# Patient Record
Sex: Female | Born: 1985 | Race: White | Hispanic: No | Marital: Married | Smoking: Never smoker
Health system: Southern US, Community
[De-identification: ages and names within clinical notes are randomized; demographics above are authoritative.]

## PROBLEM LIST (undated history)

## (undated) DIAGNOSIS — D649 Anemia, unspecified: Secondary | ICD-10-CM

## (undated) HISTORY — PX: LITHOTRIPSY: SUR834

## (undated) HISTORY — PX: BREAST BIOPSY: SHX20

## (undated) HISTORY — PX: BREAST EXCISIONAL BIOPSY: SUR124

---

## 2020-12-19 ENCOUNTER — Other Ambulatory Visit: Payer: Self-pay | Admitting: Family Medicine

## 2020-12-19 DIAGNOSIS — N9489 Other specified conditions associated with female genital organs and menstrual cycle: Secondary | ICD-10-CM

## 2021-01-02 ENCOUNTER — Other Ambulatory Visit: Payer: Self-pay | Admitting: Obstetrics and Gynecology

## 2021-01-02 DIAGNOSIS — N63 Unspecified lump in unspecified breast: Secondary | ICD-10-CM

## 2021-01-08 ENCOUNTER — Other Ambulatory Visit: Payer: Self-pay

## 2021-01-10 ENCOUNTER — Inpatient Hospital Stay
Admission: RE | Admit: 2021-01-10 | Discharge: 2021-01-10 | Disposition: A | Discharge status: Home / Self Care | Payer: Self-pay | Source: Ambulatory Visit | Attending: *Deleted | Admitting: *Deleted

## 2021-01-10 ENCOUNTER — Other Ambulatory Visit: Payer: Self-pay | Admitting: *Deleted

## 2021-01-10 DIAGNOSIS — Z1231 Encounter for screening mammogram for malignant neoplasm of breast: Secondary | ICD-10-CM

## 2021-01-16 ENCOUNTER — Ambulatory Visit
Admission: RE | Admit: 2021-01-16 | Discharge: 2021-01-16 | Disposition: A | Discharge status: Home / Self Care | Payer: 59 | Source: Ambulatory Visit | Attending: Family Medicine | Admitting: Family Medicine

## 2021-01-16 ENCOUNTER — Encounter: Payer: Self-pay | Admitting: *Deleted

## 2021-01-16 DIAGNOSIS — N9489 Other specified conditions associated with female genital organs and menstrual cycle: Secondary | ICD-10-CM

## 2021-01-16 HISTORY — PX: IR RADIOLOGIST EVAL & MGMT: IMG5224

## 2021-01-16 NOTE — Consult Note (Signed)
Chief Complaint: Patient was seen in consultation today for pelvic congestion  Referring Physician(s): Linus Mako  Supervising Physician: Aletta Edouard  Patient Status: [facility] Radiology outpatient clinic  History of Present Illness: Frances Barnes is a 35 y.o. 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 She first noticed these pelvic varicosities with her second pregnancy with resolution of the issue after delivery. She had worsening symptoms with her third pregnancy and symptoms did not fully resolve after delivery. She has experienced more leg and pelvic symptoms since her last pregnancy 6 years ago. The symptoms are particularly pronounced during her menses with increased pelvic pressure and tightness as well as lower extremity aching, pressure and fatigue. She has difficulty completing activities requiring prolonged standing and compression stockings have done nothing to alleviate her symptoms. Laying down occasionally helps decrease symptom severity.   She recently met with Dr. Renaldo Reel with [facility] who ordered duplex ultrasound imaging.   Duplex Ultrasound Study: Left leg study shows refluxing flow in the great saphenous vein from the proximal thigh to the mid-calf with a large contributory varicosity, from the buttock and pelvic region intersecting with the proximal great saphenous vein. Another large pelvic varicosity comes from the posterior buttock region down to the posterior thigh. Deep venous system is patent and compressible throughout. Right leg study shows large pelvic varicosities in the medial-to-posterior thigh feeding into the thigh extension of the small saphenous vein and questionably communicating with the great saphenous vein in the mid-thigh region. There is reflux in the great saphenous vein from the mid-thigh to the ankle.  Impressions: 1. 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   Dr. Renaldo Reel has referred this patient to Interventional Radiology for further work up, evaluation and possible treatment of suspected pelvic congestion syndrome and varicose vein disease.   Allergies: Patient has no known allergies.  Medications: Prior to Admission medications   Not on File     Social History   Socioeconomic History  . Marital status: Married    Spouse name: Not on file  . Number of children: Not on file  . Years of education: Not on file  . Highest education level: Not on file  Occupational History  . Not on file  Tobacco Use  . Smoking status: Not on file  . Smokeless tobacco: Not on file  Substance and Sexual Activity  . Alcohol use: Not on file  . Drug use: Not on file  . Sexual activity: Not on file  Other Topics Concern  . Not on file  Social History Narrative  . Not on file   Social Determinants of Health   Financial Resource Strain: Not on file  Food Insecurity: Not on file  Transportation Needs: Not on file  Physical Activity: Not on file  Stress: Not on file  Social Connections: Not on file    Review of Systems: A 12 point ROS discussed and pertinent positives are indicated in the HPI above.  All other systems are negative.  Review of Systems  Constitutional: Negative for appetite change and fatigue.  Respiratory: Negative for cough and shortness of breath.   Cardiovascular: Negative for chest pain and leg swelling.       Bilateral leg discomfort/pressure/aching; worse during menstrual cycle.   Gastrointestinal: Negative for abdominal pain, diarrhea, nausea and vomiting.  Genitourinary:       Pelvic pressure during menstrual cycle  Musculoskeletal: Negative for back pain.  Neurological: Negative for headaches.    Vital Signs: BP 115/75 (BP Location: Right Arm)   Pulse 73   SpO2 100%   Physical Exam Constitutional:       General: She is not in acute distress.    Appearance: She is not ill-appearing.  Cardiovascular:     Rate and Rhythm: Normal rate and regular rhythm.     Pulses:          Dorsalis pedis pulses are 1+ on the right side and 1+ on the left side.       Posterior tibial pulses are 2+ on the right side and 2+ on the left side.     Heart sounds: Normal heart sounds.     Comments: Subtle varicose veins in the bilateral posterior calf region.  Pulmonary:     Effort: Pulmonary effort is normal.     Breath sounds: Normal breath sounds.  Abdominal:     General: Bowel sounds are normal.     Palpations: Abdomen is soft.  Genitourinary:    Comments: Superficial, tortuous pubic/outer labia varicose vein that extends into the bilateral groin/inner upper thigh region. The vein is soft/compressible and is non-tender.   Musculoskeletal:        General: Normal range of motion.     Right lower leg: No edema.     Left lower leg: No edema.  Feet:     Right foot:     Skin integrity: Skin integrity normal.     Left foot:     Skin integrity: Skin integrity normal.     Comments: Bilateral feet are warm with good capillary refill; no swelling.   Skin:    General: Skin is warm and dry.  Neurological:     Mental Status: She is alert and oriented to person, place, and time.  Psychiatric:        Mood and Affect: Mood normal.        Behavior: Behavior normal.        Thought Content: Thought content normal.        Judgment: Judgment normal.     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 The pathophysiology of varicose veins was discussed including valvular incompetence with reflux of blood into adjacent vascular structures. It was discussed that the etiology of her current varicosities could be  originating from insufficiencies of the bilateral lower veins or possibly from ovarian and/or pelvic veins. A CT venogram of the abdomen and pelvis is required for further assessment and to evaluate possible treatment options including sclerotherapy or embolization.   A scheduler from our office will call Mrs. Kahl with a date and time for her CT scan, pending insurance approval. She knows she can call our office with any questions or concerns.     Thank you for this interesting consult.  I greatly enjoyed meeting Frances Barnes and look forward to participating in their care.  A copy of this report was sent to the requesting provider on this date.  Electronically Signed: Soyla Dryer, AGACNP-BC [phone] 01/16/2021, 11:02 AM   I spent a total of  40 Minutes   in face to face in clinical consultation, greater than 50% of which was counseling/coordinating care for pelvic congestion syndrome/varicose veins.

## 2021-01-17 ENCOUNTER — Ambulatory Visit
Admission: RE | Admit: 2021-01-17 | Discharge: 2021-01-17 | Disposition: A | Discharge status: Home / Self Care | Payer: 59 | Source: Ambulatory Visit | Attending: Obstetrics and Gynecology | Admitting: Obstetrics and Gynecology

## 2021-01-17 ENCOUNTER — Other Ambulatory Visit: Payer: Self-pay | Admitting: Interventional Radiology

## 2021-01-17 ENCOUNTER — Other Ambulatory Visit: Payer: Self-pay

## 2021-01-17 DIAGNOSIS — N63 Unspecified lump in unspecified breast: Secondary | ICD-10-CM | POA: Insufficient documentation

## 2021-01-22 ENCOUNTER — Other Ambulatory Visit: Payer: Self-pay | Admitting: Obstetrics and Gynecology

## 2021-01-22 DIAGNOSIS — R928 Other abnormal and inconclusive findings on diagnostic imaging of breast: Secondary | ICD-10-CM

## 2021-01-22 DIAGNOSIS — N6310 Unspecified lump in the right breast, unspecified quadrant: Secondary | ICD-10-CM

## 2021-01-24 ENCOUNTER — Ambulatory Visit
Admission: RE | Admit: 2021-01-24 | Discharge: 2021-01-24 | Disposition: A | Discharge status: Home / Self Care | Payer: 59 | Source: Ambulatory Visit | Attending: Obstetrics and Gynecology | Admitting: Obstetrics and Gynecology

## 2021-01-24 ENCOUNTER — Other Ambulatory Visit: Payer: Self-pay

## 2021-01-24 DIAGNOSIS — R928 Other abnormal and inconclusive findings on diagnostic imaging of breast: Secondary | ICD-10-CM | POA: Insufficient documentation

## 2021-01-24 DIAGNOSIS — N6310 Unspecified lump in the right breast, unspecified quadrant: Secondary | ICD-10-CM | POA: Insufficient documentation

## 2021-01-24 HISTORY — PX: BREAST BIOPSY: SHX20

## 2021-01-27 LAB — SURGICAL PATHOLOGY

## 2021-01-29 ENCOUNTER — Other Ambulatory Visit: Payer: Self-pay | Admitting: Interventional Radiology

## 2021-01-29 DIAGNOSIS — I863 Vulval varices: Secondary | ICD-10-CM

## 2021-01-29 DIAGNOSIS — N9489 Other specified conditions associated with female genital organs and menstrual cycle: Secondary | ICD-10-CM

## 2021-02-17 ENCOUNTER — Other Ambulatory Visit: Payer: Self-pay

## 2021-02-17 ENCOUNTER — Ambulatory Visit
Admission: RE | Admit: 2021-02-17 | Discharge: 2021-02-17 | Disposition: A | Discharge status: Home / Self Care | Payer: 59 | Source: Ambulatory Visit | Attending: Interventional Radiology | Admitting: Interventional Radiology

## 2021-02-17 DIAGNOSIS — I863 Vulval varices: Secondary | ICD-10-CM | POA: Diagnosis not present

## 2021-02-17 DIAGNOSIS — N9489 Other specified conditions associated with female genital organs and menstrual cycle: Secondary | ICD-10-CM | POA: Insufficient documentation

## 2021-02-17 MED ORDER — IOHEXOL 300 MG/ML  SOLN
100.0000 mL | Freq: Once | INTRAMUSCULAR | Status: AC | PRN
  Administered 2021-02-17: 100 mL via INTRAVENOUS

## 2021-02-18 ENCOUNTER — Other Ambulatory Visit: Payer: Self-pay | Admitting: Interventional Radiology

## 2021-02-18 DIAGNOSIS — I863 Vulval varices: Secondary | ICD-10-CM

## 2021-02-18 DIAGNOSIS — N9489 Other specified conditions associated with female genital organs and menstrual cycle: Secondary | ICD-10-CM

## 2021-02-19 ENCOUNTER — Ambulatory Visit
Admission: RE | Admit: 2021-02-19 | Discharge: 2021-02-19 | Disposition: A | Discharge status: Home / Self Care | Payer: 59 | Source: Ambulatory Visit | Attending: Interventional Radiology | Admitting: Interventional Radiology

## 2021-02-19 ENCOUNTER — Encounter: Payer: Self-pay | Admitting: *Deleted

## 2021-02-19 ENCOUNTER — Other Ambulatory Visit: Payer: Self-pay | Admitting: Interventional Radiology

## 2021-02-19 ENCOUNTER — Other Ambulatory Visit: Payer: Self-pay

## 2021-02-19 DIAGNOSIS — I872 Venous insufficiency (chronic) (peripheral): Secondary | ICD-10-CM

## 2021-02-19 DIAGNOSIS — N9489 Other specified conditions associated with female genital organs and menstrual cycle: Secondary | ICD-10-CM

## 2021-02-19 DIAGNOSIS — I863 Vulval varices: Secondary | ICD-10-CM

## 2021-02-19 HISTORY — PX: IR RADIOLOGIST EVAL & MGMT: IMG5224

## 2021-02-19 NOTE — Progress Notes (Signed)
Chief Complaint: Patient was consulted remotely today (TeleHealth) for follow-up for pelvic and lower extremity venous insufficiency.  History of Present Illness: Frances Barnes is a 35 y.o. 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  A CT venogram was performed on 02/17/2021 in order to determine whether there may be a component of convincing ovarian vein reflux supplying pelvic varicosities.  She states today that she recently had significant lower extremity symptoms during a menstrual cycle with severe pressure and aching in both legs from roughly the knees up to the upper thighs.  This required her to lie down for relief.  After her cycle, the symptoms have improved significantly.   Past Surgical History:  Procedure Laterality Date  . BREAST BIOPSY Right    positive cells, atypical   . BREAST BIOPSY Right    benign   . BREAST BIOPSY Right 01/24/2021   [place] Bx, 10:00 (Q clip), path pending   . BREAST BIOPSY Right 01/24/2021   [place] Bx, 11:00 (Vision clip), path pending   . BREAST EXCISIONAL BIOPSY Right   . IR RADIOLOGIST EVAL & MGMT  01/16/2021    Allergies: Patient has no known allergies.  Medications: Prior to Admission medications   Not on File     Family History  Problem Relation Age of Onset  . Breast cancer Maternal Grandmother     Social History   Socioeconomic History  . Marital status: Married    Spouse name: Not on file  . Number of children: Not on file  . Years of education: Not on file  . Highest education level: Not on file  Occupational History  . Not on file  Tobacco Use  . Smoking status: Not on file  . Smokeless tobacco: Not on file  Substance and Sexual Activity  . Alcohol use: Not on file  . Drug use: Not on file  . Sexual activity: Not on file  Other Topics Concern  . Not on file  Social History Narrative   . Not on file   Social Determinants of Health   Financial Resource Strain: Not on file  Food Insecurity: Not on file  Transportation Needs: Not on file  Physical Activity: Not on file  Stress: Not on file  Social Connections: Not on file    Physical Exam No direct physical exam was performed (except for noted visual exam findings with Video Visits).   Vital Signs: LMP 02/10/2021 Comment: Husband: Vasectomy  Imaging: CT ABDOMEN PELVIS W WO CONTRAST  Result Date: 02/17/2021 CLINICAL DATA:  Symptomatic superficial varicosities of the vulvar region with communication to the saphenous veins bilaterally by prior duplex ultrasound. 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 EXAM: CT ABDOMEN AND PELVIS WITHOUT AND WITH CONTRAST TECHNIQUE: Multidetector CT imaging of the abdomen and pelvis was performed following the standard protocol before and following the bolus administration of intravenous contrast. CONTRAST:  OMNIPAQUE IOHEXOL 300 MG/ML  SOLN COMPARISON:  None. FINDINGS: Lower chest: No acute abnormality. Hepatobiliary: No focal liver abnormality is seen. No gallstones, gallbladder wall thickening, or biliary dilatation. Pancreas: Unremarkable. No pancreatic ductal dilatation or surrounding inflammatory changes. Spleen: Normal in size without focal abnormality. Adrenals/Urinary Tract: Adrenal glands are normal. Punctate nonobstructing renal calculi bilaterally measuring 2 mm or less in size. No hydronephrosis  or renal masses. The bladder is unremarkable. Stomach/Bowel: Bowel shows no evidence of obstruction, ileus, inflammation or lesion. No free air identified. The appendix is normal. Vascular/Lymphatic: No enlarged abdominal or pelvic lymph nodes. Both ovarian veins are visualized by CT. The left ovarian vein measures 7-8 mm in greatest caliber  and the right ovarian vein measures 6 mm in greatest caliber. Both ovarian veins can be followed to the level of the ovaries and there are no significant associated para-ovarian or para-uterine varicosities. Prominent superficial veins are noted in the region of the mons pubis in the midline and to both sides of the midline and extending to the region of the vulva. The superficial veins do not appear to communicate with branches of the ovarian veins, internal iliac veins or inferior epigastric veins. These presumably communicate with superficial saphenous veins of the proximal lower extremities bilaterally. Reproductive: Uterus and bilateral adnexa are unremarkable. Small amount of fluid in the endometrial cavity. No adnexal masses. Other: No abdominal wall hernia or abnormality. No abdominopelvic ascites. Musculoskeletal: No acute or significant osseous findings. IMPRESSION: 1. Mildly prominent caliber of bilateral ovarian veins, left greater than right. However, the ovarian veins do not communicate with any significant visualized para-ovarian or para-uterine pelvic varicosities. 2. Prominent superficial veins of the mons pubis and vulva do not appear to communicate with the ovarian veins, internal iliac veins or inferior epigastric veins. These therefore presumably communicate with superficial saphenous system of the proximal lower extremities bilaterally. 3. Punctate nonobstructing bilateral renal calculi measuring 2 mm or less. Electronically Signed   By: Aletta Edouard M.D.   On: 02/17/2021 14:19   MM CLIP PLACEMENT RIGHT  Result Date: 01/24/2021 CLINICAL DATA:  Evaluate Q biopsy marker clip and VISION biopsy marker clip following ultrasound-guided RIGHT breast biopsies. EXAM: DIAGNOSTIC RIGHT MAMMOGRAM POST ULTRASOUND BIOPSY COMPARISON:  Previous exam(s). FINDINGS: Mammographic images were obtained following ultrasound guided RIGHT breast biopsy of a 2.7 cm mass at the 10 o'clock position and biopsy of a  0.8 cm mass at the 11 o'clock position. The Q biopsy marking clip is in expected position at the site of 2.7 cm biopsied mass at the 10 o'clock position. The VISION biopsy marking clip is in expected position at the site of 0.8 cm biopsied mass at the 11 o'clock position. IMPRESSION: Appropriate positioning of the Q shaped biopsy marking clip at the site of biopsy in the UPPER OUTER RIGHT breast. Appropriate positioning of the VISION biopsy marking clip at the site of biopsy in the UPPER OUTER RIGHT breast. Final Assessment: Post Procedure Mammograms for Marker Placement Electronically Signed   By: Margarette Canada M.D.   On: 01/24/2021 09:43   [place] RT BREAST BX W LOC DEV 1ST LESION IMG BX SPEC US GUIDE  Addendum Date: 01/27/2021   ADDENDUM REPORT: 01/27/2021 11:51 ADDENDUM: PATHOLOGY revealed: Site A. BREAST, RIGHT AT 10:00; ULTRASOUND-GUIDED CORE NEEDLE BIOPSY: - FRAGMENTS OF BENIGN FIBROADENOMA. - NEGATIVE FOR ATYPICAL PROLIFERATIVE BREAST DISEASE. Pathology results are CONCORDANT with imaging findings, per Dr. Hassan Rowan. PATHOLOGY revealed: Site B. 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 - NEGATIVE FOR ATYPICAL PROLIFERATIVE BREAST DISEASE. Pathology results are CONCORDANT with imaging findings, per Dr. Hassan Rowan. Pathology results and recommendations below were discussed with patient by telephone on 01/27/2021. Patient reported biopsy site within normal limits with slight tenderness at the site. Post biopsy care instructions were reviewed, questions were answered and my direct  phone number was provided to patient. Patient was instructed to call [facility] if any concerns or questions arise related to the biopsy. Recommendation: 1. Patient instructed to continue with monthly self breast examinations, and clinical follow-up with provider as needed for history of RIGHT breast nipple discharge  which has remained unchanged (patient reports non spontaneous RIGHT nipple discharge). 2. Patient instructed to return for bilateral screening mammogram next year (due April 2023) to document stability, given history of surgical excision of RIGHT breast atypia. 3. Consider bilateral breast MRI if nipple discharge from either breast changes in character (increases, becomes bloody, spontaneous discharge, etc.). Pathology results reported by Electa Sniff RN on 01/27/2021. Electronically Signed   By: Margarette Canada M.D.   On: 01/27/2021 11:51   Result Date: 01/27/2021 CLINICAL DATA:  35 year old female for tissue sampling of 2 separate RIGHT breast masses - a 2.7 cm mass at the 10 o'clock position and a 0.8 cm mass at the 11 o'clock position. Please note that on the recent diagnostic study, a 1.7 cm RIGHT breast mass at the 1 o'clock position 6 cm from the nipple was identified and biopsy recommended. However, we now have prior outside ultrasounds from [place] that demonstrate this 1 o'clock mass to be stable from 12/16/2015. Given stability and circumscribed margins on today's study, this is considered benign and no sampling or further follow-up is recommended. After discussion with the patient, the 2.7 cm mass at the 10 o'clock position will be sampled instead of the mass at the 1 o'clock position. EXAM: ULTRASOUND GUIDED RIGHT BREAST CORE NEEDLE BIOPSY X 2 COMPARISON:  Previous exam(s). PROCEDURE: I met with the patient and we discussed the procedure of ultrasound-guided biopsy, including benefits and alternatives. We discussed the high likelihood of a successful procedure. We discussed the risks of the procedure, including infection, bleeding, tissue injury, clip migration, and inadequate sampling. Informed written consent was given. The usual time-out protocol was performed immediately prior to the procedure. ULTRASOUND-GUIDED RIGHT BREAST CORE BIOPSY #1 (2.7 cm mass at the 10 o'clock position-Q clip): Lesion  quadrant: UPPER-OUTER RIGHT breast Using sterile technique and 1% Lidocaine as local anesthetic, under direct ultrasound visualization, a 12 gauge spring-loaded device was used to perform biopsy of the 2.7 cm mass at the 10 o'clock position using a superolateral approach. At the conclusion of the procedure a Q tissue marker clip was deployed into the biopsy cavity. Follow up 2 view mammogram was performed and dictated separately. ULTRASOUND-GUIDED RIGHT BREAST CORE BIOPSY #2 (0.8 cm mass at the 11 o'clock position-VISION clip): Lesion quadrant: UPPER-OUTER RIGHT breast Using sterile technique and 1% Lidocaine as local anesthetic, under direct ultrasound visualization, a 12 gauge spring-loaded device was used to perform biopsy of the 0.8 cm mass at the 11 o'clock position using a LATERAL approach. At the conclusion of the procedure VISION tissue marker clip was deployed into the biopsy cavity. Follow up 2 view mammogram was performed and dictated separately. IMPRESSION: Ultrasound guided biopsy of the 2.7 cm RIGHT breast mass at the 10 o'clock position (Q clip) and the 0.8 cm mass at the 11 o'clock position (VISION clip). No apparent complications. Electronically Signed: By: Margarette Canada M.D. On: 01/24/2021 09:27   [place] RT BREAST BX W LOC DEV EA ADD LESION IMG BX SPEC US GUIDE  Addendum Date: 01/27/2021   ADDENDUM REPORT: 01/27/2021 11:51 ADDENDUM: PATHOLOGY revealed: Site A. BREAST, RIGHT AT 10:00; ULTRASOUND-GUIDED CORE NEEDLE BIOPSY: - FRAGMENTS OF BENIGN FIBROADENOMA. - NEGATIVE FOR ATYPICAL PROLIFERATIVE  BREAST DISEASE. Pathology results are CONCORDANT with imaging findings, per Dr. Hassan Rowan. PATHOLOGY revealed: Site B. 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 - NEGATIVE FOR ATYPICAL PROLIFERATIVE BREAST DISEASE. Pathology results are CONCORDANT with imaging findings, per Dr. Hassan Rowan. Pathology  results and recommendations below were discussed with patient by telephone on 01/27/2021. Patient reported biopsy site within normal limits with slight tenderness at the site. Post biopsy care instructions were reviewed, questions were answered and my direct phone number was provided to patient. Patient was instructed to call [facility] if any concerns or questions arise related to the biopsy. Recommendation: 1. Patient instructed to continue with monthly self breast examinations, and clinical follow-up with provider as needed for history of RIGHT breast nipple discharge which has remained unchanged (patient reports non spontaneous RIGHT nipple discharge). 2. Patient instructed to return for bilateral screening mammogram next year (due April 2023) to document stability, given history of surgical excision of RIGHT breast atypia. 3. Consider bilateral breast MRI if nipple discharge from either breast changes in character (increases, becomes bloody, spontaneous discharge, etc.). Pathology results reported by Electa Sniff RN on 01/27/2021. Electronically Signed   By: Margarette Canada M.D.   On: 01/27/2021 11:51   Result Date: 01/27/2021 CLINICAL DATA:  35 year old female for tissue sampling of 2 separate RIGHT breast masses - a 2.7 cm mass at the 10 o'clock position and a 0.8 cm mass at the 11 o'clock position. Please note that on the recent diagnostic study, a 1.7 cm RIGHT breast mass at the 1 o'clock position 6 cm from the nipple was identified and biopsy recommended. However, we now have prior outside ultrasounds from [place] that demonstrate this 1 o'clock mass to be stable from 12/16/2015. Given stability and circumscribed margins on today's study, this is considered benign and no sampling or further follow-up is recommended. After discussion with the patient, the 2.7 cm mass at the 10 o'clock position will be sampled instead of the mass at the 1 o'clock position. EXAM: ULTRASOUND GUIDED RIGHT BREAST  CORE NEEDLE BIOPSY X 2 COMPARISON:  Previous exam(s). PROCEDURE: I met with the patient and we discussed the procedure of ultrasound-guided biopsy, including benefits and alternatives. We discussed the high likelihood of a successful procedure. We discussed the risks of the procedure, including infection, bleeding, tissue injury, clip migration, and inadequate sampling. Informed written consent was given. The usual time-out protocol was performed immediately prior to the procedure. ULTRASOUND-GUIDED RIGHT BREAST CORE BIOPSY #1 (2.7 cm mass at the 10 o'clock position-Q clip): Lesion quadrant: UPPER-OUTER RIGHT breast Using sterile technique and 1% Lidocaine as local anesthetic, under direct ultrasound visualization, a 12 gauge spring-loaded device was used to perform biopsy of the 2.7 cm mass at the 10 o'clock position using a superolateral approach. At the conclusion of the procedure a Q tissue marker clip was deployed into the biopsy cavity. Follow up 2 view mammogram was performed and dictated separately. ULTRASOUND-GUIDED RIGHT BREAST CORE BIOPSY #2 (0.8 cm mass at the 11 o'clock position-VISION clip): Lesion quadrant: UPPER-OUTER RIGHT breast Using sterile technique and 1% Lidocaine as local anesthetic, under direct ultrasound visualization, a 12 gauge spring-loaded device was used to perform biopsy of the 0.8 cm mass at the 11 o'clock position using a LATERAL approach. At the conclusion of the procedure VISION tissue marker clip was deployed into the biopsy cavity. Follow up 2 view mammogram was performed and dictated  separately. IMPRESSION: Ultrasound guided biopsy of the 2.7 cm RIGHT breast mass at the 10 o'clock position (Q clip) and the 0.8 cm mass at the 11 o'clock position (VISION clip). No apparent complications. Electronically Signed: By: Margarette Canada M.D. On: 01/24/2021 09:27     Assessment and Plan:  I spoke with Mrs. Figg by phone to review CT findings with her.  The CT study demonstrates  mildly prominent caliber of bilateral ovarian veins, left greater than right, with the left ovarian vein measuring up to 7-8 mm in greatest caliber and the right ovarian vein 6 mm.  The ovarian veins can be followed to the level of the ovaries in the pelvis and are not associated with significant visualized paraovarian or parauterine varicosities.  Prominent superficial veins of the mons pubis and vulvar region are visible by CT and do not appear to communicate with the ovarian veins, internal iliac veins or inferior epigastric veins.  These presumably communicate with the superficial saphenous system of the proximal lower extremities bilaterally.  Mrs. Nance's most bothersome symptoms are in her lower extremities where prior duplex evaluation by Dr. Renaldo Reel has demonstrated reflux in both great saphenous veins and potential communication of varicosities to the short saphenous vein on the right.  Her pubic and vulvar varicosities are not very symptomatic compared to her legs and more bothersome to her cosmetically.  They were significantly symptomatic during her last pregnancy.   I told Mrs. Billey that given her prior venous evaluation and lack of improvement with use of compression garments that she would likely benefit from treatment of her saphenous veins, likely consisting of endovenous occlusion of the saphenous veins which may also decompress her pubic and vulvar varicosities.  Given that she has already been evaluated by Dr. Renaldo Reel, I gave her the option to return to him for treatment.  She opted to continue evaluation here and I did tell her that I would personally have to repeat an insufficiency venous duplex study of both of her lower extremities for treatment planning in order to outline exactly where her insufficient segments are and where communications are present to the protruding pubic and vulvar varicosities and other lower extremity varicosities. She is agreeable to an  ultrasound study and we will get that scheduled soon.   Electronically Signed: Azzie Roup 02/19/2021, 8:28 AM     I spent a total of 15 Minutes in remote  clinical consultation, greater than 50% of which was counseling/coordinating care for venous insufficiency.    Visit type: Audio only (telephone). Audio (no video) only due to patient's lack of internet/smartphone capability. Alternative for in-person consultation at [facility], [address] [place], [place], [place]. This visit type was conducted due to national recommendations for restrictions regarding the COVID-19 Pandemic (e.g. social distancing).  This format is felt to be most appropriate for this patient at this time.  All issues noted in this document were discussed and addressed.

## 2021-03-18 ENCOUNTER — Ambulatory Visit
Admission: RE | Admit: 2021-03-18 | Discharge: 2021-03-18 | Disposition: A | Discharge status: Home / Self Care | Payer: 59 | Source: Ambulatory Visit | Attending: Interventional Radiology | Admitting: Interventional Radiology

## 2021-03-18 ENCOUNTER — Other Ambulatory Visit: Payer: Self-pay

## 2021-03-18 DIAGNOSIS — I872 Venous insufficiency (chronic) (peripheral): Secondary | ICD-10-CM

## 2021-03-18 DIAGNOSIS — I863 Vulval varices: Secondary | ICD-10-CM

## 2021-03-18 DIAGNOSIS — N9489 Other specified conditions associated with female genital organs and menstrual cycle: Secondary | ICD-10-CM

## 2021-03-18 NOTE — Progress Notes (Signed)
Chief Complaint: Follow up for venous insufficiency.  History of Present Illness: Frances Barnes is a 35 y.o. 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  A CT venogram was performed on 02/17/2021 in order to determine whether there may be a component of convincing ovarian vein reflux supplying pelvic varicosities. Prominent superficial veins of the mons pubis and vulvar region are visible by CT and do not appear to communicate with the ovarian veins, internal iliac veins or inferior epigastric veins.  These presumably communicate with the superficial saphenous system of the proximal lower extremities bilaterally.  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 Her right leg remains slightly more symptomatic than her left with more prominent varicosities overall than the left leg. She is here for a lower extremity venous insufficiency ultrasound evaluation with treatment planning discussion to follow.   Past Surgical History:  Procedure Laterality Date  . BREAST BIOPSY Right    positive cells, atypical   . BREAST BIOPSY Right    benign   . BREAST BIOPSY Right 01/24/2021   [place] Bx, 10:00 (Q clip), path pending   . BREAST BIOPSY Right 01/24/2021   [place] Bx, 11:00 (Vision clip), path pending   . BREAST EXCISIONAL BIOPSY Right   . IR RADIOLOGIST EVAL & MGMT  01/16/2021  . IR RADIOLOGIST EVAL & MGMT  02/19/2021    Allergies: Patient has no known allergies.  Medications: Prior to Admission medications   Not on File     Family History  Problem Relation Age of Onset  . Breast cancer Maternal Grandmother     Social History   Socioeconomic History  . Marital status: Married    Spouse name: Not on file  . Number of  children: Not on file  . Years of education: Not on file  . Highest education level: Not on file  Occupational History  . Not on file  Tobacco Use  . Smoking status: Not on file  . Smokeless tobacco: Not on file  Substance and Sexual Activity  . Alcohol use: Not on file  . Drug use: Not on file  . Sexual activity: Not on file  Other Topics Concern  . Not on file  Social History Narrative  . Not on file   Social Determinants of Health   Financial Resource Strain: Not on file  Food Insecurity: Not on file  Transportation Needs: Not on file  Physical Activity: Not on file  Stress: Not on file  Social Connections: Not on file    Vital Signs: BP 119/66 (BP Location: Right Arm)   Pulse 78   SpO2 100%   Physical Exam Musculoskeletal:     Comments: Right lower extremity: palpable and visible superficial varicosities of medial proximal thigh, proximal calf and vulvar/labial region.  Left lower extremity: palpable varicosities of posterior calf and vulvar/labial region.  No significant edema bilaterally. No ulcerations. Normal arterial perfusion.  Skin:    General: Skin is warm and dry.     Findings: No bruising, erythema, lesion or rash.     Imaging: CT ABDOMEN PELVIS W WO CONTRAST  Result Date: 02/17/2021 CLINICAL DATA:  Symptomatic superficial varicosities of the vulvar region with communication to the saphenous veins bilaterally by prior duplex ultrasound. 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 EXAM: CT ABDOMEN AND PELVIS WITHOUT AND WITH CONTRAST TECHNIQUE: Multidetector CT imaging of the abdomen and pelvis was performed following the standard protocol before and following the bolus administration of intravenous contrast. CONTRAST:  155mL OMNIPAQUE IOHEXOL 300 MG/ML  SOLN COMPARISON:  None. FINDINGS: Lower chest: No acute  abnormality. Hepatobiliary: No focal liver abnormality is seen. No gallstones, gallbladder wall thickening, or biliary dilatation. Pancreas: Unremarkable. No pancreatic ductal dilatation or surrounding inflammatory changes. Spleen: Normal in size without focal abnormality. Adrenals/Urinary Tract: Adrenal glands are normal. Punctate nonobstructing renal calculi bilaterally measuring 2 mm or less in size. No hydronephrosis or renal masses. The bladder is unremarkable. Stomach/Bowel: Bowel shows no evidence of obstruction, ileus, inflammation or lesion. No free air identified. The appendix is normal. Vascular/Lymphatic: No enlarged abdominal or pelvic lymph nodes. Both ovarian veins are visualized by CT. The left ovarian vein measures 7-8 mm in greatest caliber and the right ovarian vein measures 6 mm in greatest caliber. Both ovarian veins can be followed to the level of the ovaries and there are no significant associated para-ovarian or para-uterine varicosities. Prominent superficial veins are noted in the region of the mons pubis in the midline and to both sides of the midline and extending to the region of the vulva. The superficial veins do not appear to communicate with branches of the ovarian veins, internal iliac veins or inferior epigastric veins. These presumably communicate with superficial saphenous veins of the proximal lower extremities bilaterally. Reproductive: Uterus and bilateral adnexa are unremarkable. Small amount of fluid in the endometrial cavity. No adnexal masses. Other: No abdominal wall hernia or abnormality. No abdominopelvic ascites. Musculoskeletal: No acute or significant osseous findings. IMPRESSION: 1. Mildly prominent caliber of bilateral ovarian veins, left greater than right. However, the ovarian veins do not communicate with any significant visualized para-ovarian or para-uterine pelvic varicosities. 2. Prominent superficial veins of the mons pubis and vulva do not appear to  communicate with the ovarian veins, internal iliac veins or inferior epigastric veins. These therefore presumably communicate with superficial saphenous system of the proximal lower extremities bilaterally. 3. Punctate nonobstructing bilateral renal calculi measuring 2 mm or less. Electronically Signed   By: Aletta Edouard M.D.   On: 02/17/2021 14:19   US Venous Img Lower Bilateral (DVT)  Result Date: 03/18/2021 CLINICAL DATA:  Bilateral lower extremity venous insufficiency with symptomatic thigh and calf varicosities as well as varicosities on both size of the midline in the vulvar and labial regions. EXAM: BILATERAL LOWER EXTREMITY VENOUS DUPLEX ULTRASOUND TECHNIQUE: Gray-scale sonography with graded compression, as well as color Doppler and duplex ultrasound, were performed to evaluate the deep and superficial veins of both lower extremities. Spectral Doppler was utilized to evaluate flow at rest and with distal augmentation maneuvers. A complete superficial venous insufficiency exam was performed in the upright standing. I personally performed the technical portion of the exam. COMPARISON:  None. FINDINGS: RIGHT Deep Venous System: Evaluation of the deep venous system including the common femoral, femoral, profunda femoral, popliteal and calf veins (where visible) demonstrate no evidence of deep venous thrombosis. The vessels are compressible and demonstrate normal respiratory phasicity and response to augmentation. No evidence of deep venous reflux. RIGHT Superficial Venous System: SFJ: Reflux with augmentation lasting greater than 2 seconds. GSV Prox Thigh: 13 mm in greatest diameter. Reflux with augmentation lasting greater than 2 seconds. GSV Mid Thigh: 6 mm.  Reflux with  augmentation. GSV Lower Thigh: 6 mm.  Reflux with augmentation. GSV Prox Calf: 4-6 mm.  Reflux with augmentation. GSV Mid Calf: 4 mm.  Reflux with augmentation. GSV Distal Calf: 3 mm.  Minimal reflux. SPJ: No true saphenopopliteal  junction present. SSV Prox: 5 mm.  Mild reflux with augmentation. SSV Mid: 3 mm.  Mild reflux with augmentation. SSV Distal: 4 mm.  Mild reflux with augmentation. Other: The short saphenous vein continues in the posterior thigh as a vein of Giacomini that continues along the medial aspect of the thigh then courses anteriorly in the proximal thigh and communicates with branches that ultimately communicate with varicosities of the medial thigh and labial and vulvar region and back to branches that communicate with the proximal great saphenous vein. The great saphenous vein communicates with varicosities of the medial thigh and calf region. The short saphenous vein demonstrates a distal thigh perforator vein. LEFT Deep Venous System: Evaluation of the deep venous system including the common femoral, femoral, profunda femoral, popliteal and calf veins (where visible) demonstrate no evidence of deep venous thrombosis. The vessels are compressible and demonstrate normal respiratory phasicity and response to augmentation. No evidence of deep venous reflux. LEFT Superficial Venous System: SFJ: Reflux with augmentation lasting greater than 2 seconds. GSV Prox Thigh: 11 mm.  Reflux with augmentation. GSV Mid Thigh: 5 mm.  Reflux with augmentation. GSV Lower Thigh: 5 mm.  Reflux with augmentation. GSV Prox Calf: 5 mm.  Reflux with augmentation. GSV Mid Calf: 3 mm.  Reflux with augmentation. GSV Distal Calf: 3 mm.  Minimal reflux. SPJ: No true saphenopopliteal junction present. SSV Prox: 4 mm.  Mild reflux with augmentation. SSV Mid: 3 mm.  Mild reflux with augmentation. SSV Distal: 2 mm.  Mild reflux with augmentation. Other: The short saphenous vein on the left also continues in the posterior thigh as a vein of Giacomini along the medial aspect of the thigh and communicates anteriorly with branches that communicate with labial and vulvar varicosities that communicate back to branches that communicate with the proximal great  saphenous vein. There are 2 separate mid calf perforator veins identified communicating with the great saphenous vein. A perforator vein of the mid calf communicates with the short saphenous vein. Varicosities of the calf communicate with both the short saphenous and great saphenous veins. IMPRESSION: Bilateral superficial venous insufficiency involving bilateral great saphenous and short saphenous veins. The short saphenous veins continue along the posteromedial thigh as a vein of Giacomini bilaterally that eventually communicate with labial and vulvar varicosities as well as the proximal great saphenous vein. Thigh and calf varicosities on the right and calf varicosities on the left communicate with both great saphenous and short saphenous systems. 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 Electronically Signed   By: Aletta Edouard M.D.   On: 03/18/2021 16:41   [place] RAD EVAL AND MGMT  Result Date: 03/18/2021 Please refer to "Notes" to see consult details.  IR Radiologist Eval & Mgmt  Result Date: 02/19/2021 Please refer to notes tab for details about interventional procedure. (Op Note)   Assessment and Plan:  Assessment by ultrasound today demonstrates a similar pattern of superficial venous insufficiency bilaterally.  Bilateral great saphenous veins demonstrate significant insufficiency especially in the proximal thighs and near the saphenofemoral junctions where they are substantially enlarged bilaterally.  Branches of the proximal great saphenous veins communicate with varicosities of bilateral vulvar and labial regions and also communicate with the short saphenous vein via veins of Giacomini  bilaterally that ascend from the short saphenous vein up to the posteromedial thigh and to the anterior proximal thigh.  Varicosities of the medial right thigh and right calf communicate with both great saphenous and short saphenous venous systems.  Varicosities of the proximal  left calf communicate with great saphenous and short saphenous venous systems.  There are some perforator veins present bilaterally.   CEAP classification of venous disease in both lower extremities is C2s, Ep, As, Pr. She is symptomatic in both lower extremities with pain, aching and tiredness bilaterally. She would benefit from staged endovenous laser occlusion of both great saphenous veins, both short saphenous veins extending into veins of Giacomini and potential sclerotherapy of residual patent calf, vulvar and labial varicosities. Her right lower extremity is more symptomatic and would benefit from treatment first. She has failed prior conservative therapy after previous evaluation by Dr. Renaldo Reel at [facility].   Frances Barnes would like to proceed with venous treatment. We will begin the authorization and scheduling process.    Electronically Signed: Azzie Roup 03/18/2021, 4:42 PM     I spent a total of 15 Minutes in face to face in clinical consultation, greater than 50% of which was counseling/coordinating care for bilateral lower extremity and pelvic region venous insufficiency.

## 2021-06-16 ENCOUNTER — Other Ambulatory Visit: Payer: Self-pay | Admitting: Interventional Radiology

## 2021-06-16 DIAGNOSIS — I872 Venous insufficiency (chronic) (peripheral): Secondary | ICD-10-CM

## 2022-02-05 ENCOUNTER — Other Ambulatory Visit: Payer: Self-pay | Admitting: Obstetrics and Gynecology

## 2022-02-05 DIAGNOSIS — Z1231 Encounter for screening mammogram for malignant neoplasm of breast: Secondary | ICD-10-CM

## 2022-06-11 ENCOUNTER — Other Ambulatory Visit: Payer: Self-pay | Admitting: Obstetrics and Gynecology

## 2022-07-14 ENCOUNTER — Encounter
Admission: RE | Admit: 2022-07-14 | Discharge: 2022-07-14 | Disposition: A | Discharge status: Home / Self Care | Payer: Managed Care, Other (non HMO) | Source: Ambulatory Visit | Attending: Obstetrics and Gynecology | Admitting: Obstetrics and Gynecology

## 2022-07-14 ENCOUNTER — Other Ambulatory Visit: Payer: Self-pay

## 2022-07-14 HISTORY — DX: Anemia, unspecified: D64.9

## 2022-07-14 NOTE — Pre-Procedure Instructions (Addendum)
Pre- admit phone interview completed. Since patient is unable to pick up patient instructions due to distance factor, therefore patient surgical instruction was discussed with patient over the phone and she verbalized understanding.

## 2022-07-14 NOTE — H&P (Signed)
Chief Complaint:   Frances Barnes is a 36 y.o. female presenting with Pre Op Consulting (Surgical consult - discuss/schedule hysterectomy)     History of Present Illness: Patient with menometrorrhagia returns today to discuss her desire for hysterectomy for management after failed medical management.     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 PROLIFERATIVE ENDOMETRIUM. NEGATIVE FOR GRANULOMAS, HYPERPLASIA AND MALIGNANCY.   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  Past Surgical History:  has a past surgical history that includes Mastectomy partial / lumpectomy and Breast biopsy. Family History: family history includes Breast cancer in her maternal grandmother. Social History:  reports that she has never smoked. She has never used smokeless tobacco. She reports that she does not drink alcohol and does not use drugs. OB/GYN History:   OB History        Gravida 3   Para 3   Term 3   Preterm     AB     Living 3        SAB     IAB     Ectopic     Molar     Multiple     Live Births 3         Allergies: is allergic to pertussis vaccines. Medications: Current Outpatient Medications:    ergocalciferol, vitamin D2, 1,250 mcg (50,000 unit) capsule, Take 50,000 Units by mouth once a week, Disp: , Rfl:    valACYclovir (VALTREX) 1000 MG tablet, Take 1 tablet daily x 5 days as needed for outbreaks, Disp: 30 tablet, Rfl: 1   ferrous sulfate (IRON ORAL), Take by mouth (Patient not taking: Reported on 01/02/2021), Disp: , Rfl:    Review of Systems: No SOB, no palpitations or chest pain, no new lower extremity edema, no nausea or vomiting or bowel or bladder complaints. See HPI for gyn specific ROS.      Exam:   Ht 172.7 cm ('5\' 8"'$ )   Wt 70.3 kg (155 lb)   BMI 23.57 kg/m    Constitutional:  General appearance: Well nourished, well developed female in no acute distress.  Pulm: CTAB CV: RRR Neuro/psych:  Normal mood and affect. No gross motor deficits. Neck:  Supple, normal appearance.  Respiratory:  Normal respiratory effort, no use of accessory muscles Skin:  No visible rashes or external lesions     Impression:   The encounter diagnosis was Excessive or frequent menstruation.    Plan:   1. Menometrorrhagia - failed medical management, Preop for hysterectomy  -Patient returns to discuss her desire for hysterectomy for surgical treatment of her menometrorrhagia.    Discussed with her the plan for total laparoscopic hysterectomy with bilateral salpingectomy.  We may perform a cystoscopy to evaluate the urinary tract after the procedure, if surgically indicated for uro tract integrity.    The patient and I discussed the technical aspects of the procedure including the potential for risks and complications.  These include but are not limited to the risk of infection requiring post-operative antibiotics or further procedures.  We talked about the risk of injury to adjacent organs including bladder, bowel, ureter, blood vessels or nerves.  We talked about the need to convert to  an open incision.  We talked about the possible need for blood transfusion.  We talked about postop complications such as thromboembolic or cardiopulmonary complications.  All of her questions were answered.      Diagnoses and all orders for this visit:   Excessive or frequent menstruation

## 2022-07-14 NOTE — Patient Instructions (Addendum)
Your procedure is scheduled on: 10/ 02/2022 Report to the Registration Desk on the 1st floor of the [facility]. To find out your arrival time, please call [phone] between 1PM - 3PM on: 07/22/2022  If your arrival time is 6:00 am, do not arrive prior to that time as the [facility] entrance doors do not open until 6:00 am.  REMEMBER: Instructions that are not followed completely may result in serious medical risk, up to and including death; or upon the discretion of your surgeon and anesthesiologist your surgery may need to be rescheduled.  Do not eat food after midnight the night before surgery.  No gum chewing, lozengers or hard candies.  You may however, drink CLEAR liquids up to 2 hours before you are scheduled to arrive for your surgery. Do not drink anything within 2 hours of your scheduled arrival time.  Clear liquids include: - water  - apple juice without pulp - gatorade (not RED colors) - black coffee or tea (Do NOT add milk or creamers to the coffee or tea) Do NOT drink anything that is not on this list.   One week prior to surgery: Stop Anti-inflammatories (NSAIDS) such as Advil, Aleve, Ibuprofen, Motrin, Naproxen, Naprosyn and Aspirin based products such as Excedrin, Goodys Powder, BC Powder. Stop ANY OVER THE COUNTER supplements until after surgery. You may however, continue to take Tylenol if needed for pain up until the day of surgery.  No Alcohol for 24 hours before or after surgery.  No Smoking including e-cigarettes for 24 hours prior to surgery.  No chewable tobacco products for at least 6 hours prior to surgery.  No nicotine patches on the day of surgery.  Do not use any "recreational" drugs for at least a week prior to your surgery.  Please be advised that the combination of cocaine and anesthesia may have negative outcomes, up to and including death. If you test positive for cocaine, your surgery will be cancelled.  On the morning of surgery brush  your teeth with toothpaste and water, you may rinse your mouth with mouthwash if you wish. Do not swallow any toothpaste or mouthwash.  Use DIAL SOAP WITH SHOWER since you can't pick the CHG soap from pre-admit office  Do not wear jewelry, make-up, hairpins, clips or nail polish.  Do not wear lotions, powders, or perfumes.   Do not shave body from the neck down 48 hours prior to surgery just in case you cut yourself which could leave a site for infection.  Also, freshly shaved skin may become irritated if using the CHG soap.  Contact lenses, hearing aids and dentures may not be worn into surgery.  Do not bring valuables to the hospital. [facility] is not responsible for any missing/lost belongings or valuables.   Notify your doctor if there is any change in your medical condition (cold, fever, infection).  Wear comfortable clothing (specific to your surgery type) to the hospital.  After surgery, you can help prevent lung complications by doing breathing exercises.  Take deep breaths and cough every 1-2 hours. Your doctor may order a device called an Incentive Spirometer to help you take deep breaths. When coughing or sneezing, hold a pillow firmly against your incision with both hands. This is called "splinting." Doing this helps protect your incision. It also decreases belly discomfort.  If you are being admitted to the hospital overnight, leave your suitcase in the car. After surgery it may be brought to your room.  If you  are being discharged the day of surgery, you will not be allowed to drive home. You will need a responsible adult (18 years or older) to drive you home and stay with you that night.   If you are taking public transportation, you will need to have a responsible adult (18 years or older) with you. Please confirm with your physician that it is acceptable to use public transportation.   Please call the [facility] Dept. at [phone] if you have any  questions about these instructions.  Surgery Visitation Policy:  Patients undergoing a surgery or procedure may have two family members or support persons with them as long as the person is not COVID-19 positive or experiencing its symptoms.   Inpatient Visitation:    Visiting hours are 7 a.m. to 8 p.m. Up to four visitors are allowed at one time in a patient room, including children. The visitors may rotate out with other people during the day. One designated support person (adult) may remain overnight.

## 2022-07-23 ENCOUNTER — Ambulatory Visit: Payer: Managed Care, Other (non HMO) | Admitting: Urgent Care

## 2022-07-23 ENCOUNTER — Ambulatory Visit
Admission: RE | Admit: 2022-07-23 | Discharge: 2022-07-23 | Disposition: A | Discharge status: Home / Self Care | Payer: Managed Care, Other (non HMO) | Attending: Obstetrics and Gynecology | Admitting: Obstetrics and Gynecology

## 2022-07-23 ENCOUNTER — Encounter: Payer: Self-pay | Admitting: Obstetrics and Gynecology

## 2022-07-23 ENCOUNTER — Other Ambulatory Visit: Payer: Self-pay

## 2022-07-23 ENCOUNTER — Encounter
Admission: RE | Disposition: A | Discharge status: Home / Self Care | Payer: Self-pay | Source: Home / Self Care | Attending: Obstetrics and Gynecology

## 2022-07-23 DIAGNOSIS — D259 Leiomyoma of uterus, unspecified: Secondary | ICD-10-CM | POA: Insufficient documentation

## 2022-07-23 DIAGNOSIS — N921 Excessive and frequent menstruation with irregular cycle: Secondary | ICD-10-CM | POA: Insufficient documentation

## 2022-07-23 DIAGNOSIS — N838 Other noninflammatory disorders of ovary, fallopian tube and broad ligament: Secondary | ICD-10-CM | POA: Insufficient documentation

## 2022-07-23 DIAGNOSIS — N8003 Adenomyosis of the uterus: Secondary | ICD-10-CM | POA: Diagnosis not present

## 2022-07-23 HISTORY — PX: TOTAL LAPAROSCOPIC HYSTERECTOMY WITH SALPINGECTOMY: SHX6742

## 2022-07-23 LAB — RPR: RPR Ser Ql: NONREACTIVE

## 2022-07-23 LAB — CBC
HCT: 33.2 % — ABNORMAL LOW (ref 36.0–46.0)
Hemoglobin: 9.9 g/dL — ABNORMAL LOW (ref 12.0–15.0)
MCH: 23.3 pg — ABNORMAL LOW (ref 26.0–34.0)
MCHC: 29.8 g/dL — ABNORMAL LOW (ref 30.0–36.0)
MCV: 78.3 fL — ABNORMAL LOW (ref 80.0–100.0)
Platelets: 379 10*3/uL (ref 150–400)
RBC: 4.24 MIL/uL (ref 3.87–5.11)
RDW: 16 % — ABNORMAL HIGH (ref 11.5–15.5)
WBC: 6.6 10*3/uL (ref 4.0–10.5)
nRBC: 0 % (ref 0.0–0.2)

## 2022-07-23 LAB — BASIC METABOLIC PANEL
Anion gap: 6 (ref 5–15)
BUN: 17 mg/dL (ref 6–20)
CO2: 24 mmol/L (ref 22–32)
Calcium: 8.7 mg/dL — ABNORMAL LOW (ref 8.9–10.3)
Chloride: 108 mmol/L (ref 98–111)
Creatinine, Ser: 0.65 mg/dL (ref 0.44–1.00)
GFR, Estimated: >60 mL/min (ref >60)
Glucose, Bld: 96 mg/dL (ref 70–99)
Potassium: 4 mmol/L (ref 3.5–5.1)
Sodium: 138 mmol/L (ref 135–145)

## 2022-07-23 LAB — ABO/RH: ABO/RH(D): O POS

## 2022-07-23 LAB — TYPE AND SCREEN
ABO/RH(D): O POS
Antibody Screen: NEGATIVE

## 2022-07-23 LAB — POCT PREGNANCY, URINE: Preg Test, Ur: NEGATIVE

## 2022-07-23 SURGERY — HYSTERECTOMY, TOTAL, LAPAROSCOPIC, WITH SALPINGECTOMY
Anesthesia: General | Site: Vagina | Laterality: Bilateral

## 2022-07-23 MED ORDER — IBUPROFEN 800 MG PO TABS: 800.0000 mg | ORAL_TABLET | Freq: Three times a day (TID) | ORAL | 1 refills | Status: AC

## 2022-07-23 MED ORDER — KETOROLAC TROMETHAMINE 30 MG/ML IJ SOLN
INTRAMUSCULAR | Status: DC | PRN
  Administered 2022-07-23: 30 mg via INTRAVENOUS

## 2022-07-23 MED ORDER — FENTANYL CITRATE (PF) 100 MCG/2ML IJ SOLN
25.0000 ug | INTRAMUSCULAR | Status: DC | PRN
  Administered 2022-07-23 (×4): 25 ug via INTRAVENOUS

## 2022-07-23 MED ORDER — ACETAMINOPHEN EXTRA STRENGTH 500 MG PO TABS: 1000.0000 mg | ORAL_TABLET | Freq: Four times a day (QID) | ORAL | 0 refills | Status: AC

## 2022-07-23 MED ORDER — LACTATED RINGERS IV SOLN: INTRAVENOUS | Status: DC

## 2022-07-23 MED ORDER — OXYCODONE HCL 5 MG PO TABS
ORAL_TABLET | ORAL | Status: AC
  Filled 2022-07-23: qty 1

## 2022-07-23 MED ORDER — ONDANSETRON 4 MG PO TBDP: 4.0000 mg | ORAL_TABLET | Freq: Three times a day (TID) | ORAL | 0 refills | Status: AC | PRN

## 2022-07-23 MED ORDER — SENNA 8.6 MG PO TABS: 1.0000 | ORAL_TABLET | Freq: Every evening | ORAL | Status: DC | PRN

## 2022-07-23 MED ORDER — CEFAZOLIN SODIUM-DEXTROSE 2-4 GM/100ML-% IV SOLN
2.0000 g | INTRAVENOUS | Status: AC
  Administered 2022-07-23: 2 g via INTRAVENOUS

## 2022-07-23 MED ORDER — FAMOTIDINE 20 MG PO TABS
20.0000 mg | ORAL_TABLET | Freq: Once | ORAL | Status: AC
  Administered 2022-07-23: 20 mg via ORAL

## 2022-07-23 MED ORDER — ONDANSETRON HCL 4 MG/2ML IJ SOLN
INTRAMUSCULAR | Status: AC
  Filled 2022-07-23: qty 2

## 2022-07-23 MED ORDER — ACETAMINOPHEN 500 MG PO TABS
1000.0000 mg | ORAL_TABLET | ORAL | Status: AC
  Administered 2022-07-23: 1000 mg via ORAL

## 2022-07-23 MED ORDER — ORAL CARE MOUTH RINSE: 15.0000 mL | Freq: Once | OROMUCOSAL | Status: AC

## 2022-07-23 MED ORDER — CHLORHEXIDINE GLUCONATE 0.12 % MT SOLN
OROMUCOSAL | Status: AC
  Filled 2022-07-23: qty 15

## 2022-07-23 MED ORDER — ROCURONIUM BROMIDE 100 MG/10ML IV SOLN
INTRAVENOUS | Status: DC | PRN
  Administered 2022-07-23: 20 mg via INTRAVENOUS
  Administered 2022-07-23 (×2): 40 mg via INTRAVENOUS

## 2022-07-23 MED ORDER — OXYCODONE HCL 5 MG PO TABS
5.0000 mg | ORAL_TABLET | Freq: Once | ORAL | Status: AC | PRN
  Administered 2022-07-23: 5 mg via ORAL

## 2022-07-23 MED ORDER — LIDOCAINE HCL (PF) 2 % IJ SOLN
INTRAMUSCULAR | Status: AC
  Filled 2022-07-23: qty 5

## 2022-07-23 MED ORDER — POVIDONE-IODINE 10 % EX SWAB
2.0000 | Freq: Once | CUTANEOUS | Status: AC
  Administered 2022-07-23: 2 via TOPICAL

## 2022-07-23 MED ORDER — DEXAMETHASONE SODIUM PHOSPHATE 10 MG/ML IJ SOLN
INTRAMUSCULAR | Status: AC
  Filled 2022-07-23: qty 1

## 2022-07-23 MED ORDER — FENTANYL CITRATE (PF) 100 MCG/2ML IJ SOLN
INTRAMUSCULAR | Status: AC
  Administered 2022-07-23: 50 ug via INTRAVENOUS
  Filled 2022-07-23: qty 2

## 2022-07-23 MED ORDER — MIDAZOLAM HCL 2 MG/2ML IJ SOLN
INTRAMUSCULAR | Status: AC
  Filled 2022-07-23: qty 2

## 2022-07-23 MED ORDER — BUPIVACAINE HCL 0.5 % IJ SOLN
INTRAMUSCULAR | Status: DC | PRN
  Administered 2022-07-23: 13 mL

## 2022-07-23 MED ORDER — FENTANYL CITRATE (PF) 100 MCG/2ML IJ SOLN
INTRAMUSCULAR | Status: AC
  Filled 2022-07-23: qty 2

## 2022-07-23 MED ORDER — SUGAMMADEX SODIUM 200 MG/2ML IV SOLN
INTRAVENOUS | Status: DC | PRN
  Administered 2022-07-23: 200 mg via INTRAVENOUS

## 2022-07-23 MED ORDER — ACETAMINOPHEN 500 MG PO TABS
ORAL_TABLET | ORAL | Status: AC
  Filled 2022-07-23: qty 2

## 2022-07-23 MED ORDER — FENTANYL CITRATE (PF) 100 MCG/2ML IJ SOLN
INTRAMUSCULAR | Status: DC | PRN
  Administered 2022-07-23: 25 ug via INTRAVENOUS
  Administered 2022-07-23: 50 ug via INTRAVENOUS

## 2022-07-23 MED ORDER — FAMOTIDINE 20 MG PO TABS
ORAL_TABLET | ORAL | Status: AC
  Filled 2022-07-23: qty 1

## 2022-07-23 MED ORDER — OXYCODONE HCL 5 MG/5ML PO SOLN: 5.0000 mg | Freq: Once | ORAL | Status: AC | PRN

## 2022-07-23 MED ORDER — PHENYLEPHRINE HCL (PRESSORS) 10 MG/ML IV SOLN
INTRAVENOUS | Status: DC | PRN
  Administered 2022-07-23 (×3): 80 ug via INTRAVENOUS

## 2022-07-23 MED ORDER — LIDOCAINE HCL (CARDIAC) PF 100 MG/5ML IV SOSY
PREFILLED_SYRINGE | INTRAVENOUS | Status: DC | PRN
  Administered 2022-07-23: 70 mg via INTRAVENOUS

## 2022-07-23 MED ORDER — GABAPENTIN 300 MG PO CAPS
300.0000 mg | ORAL_CAPSULE | ORAL | Status: AC
  Administered 2022-07-23: 300 mg via ORAL

## 2022-07-23 MED ORDER — GABAPENTIN 300 MG PO CAPS
ORAL_CAPSULE | ORAL | Status: AC
  Filled 2022-07-23: qty 1

## 2022-07-23 MED ORDER — MIDAZOLAM HCL 2 MG/2ML IJ SOLN
INTRAMUSCULAR | Status: DC | PRN
  Administered 2022-07-23: 2 mg via INTRAVENOUS

## 2022-07-23 MED ORDER — ONDANSETRON HCL 4 MG/2ML IJ SOLN
4.0000 mg | Freq: Once | INTRAMUSCULAR | Status: AC | PRN
  Administered 2022-07-23: 4 mg via INTRAVENOUS

## 2022-07-23 MED ORDER — DOCUSATE SODIUM 100 MG PO CAPS: 100.0000 mg | ORAL_CAPSULE | Freq: Two times a day (BID) | ORAL | 0 refills | Status: AC

## 2022-07-23 MED ORDER — SENNA 8.6 MG PO TABS: 1.0000 | ORAL_TABLET | Freq: Every evening | ORAL | 0 refills | Status: AC | PRN

## 2022-07-23 MED ORDER — DEXAMETHASONE SODIUM PHOSPHATE 10 MG/ML IJ SOLN
INTRAMUSCULAR | Status: DC | PRN
  Administered 2022-07-23: 10 mg via INTRAVENOUS

## 2022-07-23 MED ORDER — PHENYLEPHRINE 80 MCG/ML (10ML) SYRINGE FOR IV PUSH (FOR BLOOD PRESSURE SUPPORT)
PREFILLED_SYRINGE | INTRAVENOUS | Status: AC
  Filled 2022-07-23: qty 30

## 2022-07-23 MED ORDER — ONDANSETRON HCL 4 MG/2ML IJ SOLN
INTRAMUSCULAR | Status: DC | PRN
  Administered 2022-07-23: 4 mg via INTRAVENOUS

## 2022-07-23 MED ORDER — OXYCODONE HCL 5 MG PO TABS: 5.0000 mg | ORAL_TABLET | ORAL | 0 refills | Status: AC | PRN

## 2022-07-23 MED ORDER — PROPOFOL 10 MG/ML IV BOLUS
INTRAVENOUS | Status: AC
  Filled 2022-07-23: qty 20

## 2022-07-23 MED ORDER — CEFAZOLIN SODIUM-DEXTROSE 2-4 GM/100ML-% IV SOLN
INTRAVENOUS | Status: AC
  Filled 2022-07-23: qty 100

## 2022-07-23 MED ORDER — ROCURONIUM BROMIDE 10 MG/ML (PF) SYRINGE
PREFILLED_SYRINGE | INTRAVENOUS | Status: AC
  Filled 2022-07-23: qty 10

## 2022-07-23 MED ORDER — ACETAMINOPHEN 10 MG/ML IV SOLN: 1000.0000 mg | Freq: Once | INTRAVENOUS | Status: DC | PRN

## 2022-07-23 MED ORDER — CHLORHEXIDINE GLUCONATE 0.12 % MT SOLN
15.0000 mL | Freq: Once | OROMUCOSAL | Status: AC
  Administered 2022-07-23: 15 mL via OROMUCOSAL

## 2022-07-23 MED ORDER — BUPIVACAINE HCL (PF) 0.5 % IJ SOLN
INTRAMUSCULAR | Status: AC
  Filled 2022-07-23: qty 30

## 2022-07-23 MED ORDER — PROPOFOL 10 MG/ML IV BOLUS
INTRAVENOUS | Status: DC | PRN
  Administered 2022-07-23: 150 mg via INTRAVENOUS

## 2022-07-23 SURGICAL SUPPLY — 58 items
APPLICATOR ARISTA FLEXITIP XL (MISCELLANEOUS) IMPLANT
BAG URINE DRAIN 2000ML AR STRL (UROLOGICAL SUPPLIES) ×2 IMPLANT
BLADE SURG SZ11 CARB STEEL (BLADE) ×1 IMPLANT
CATH FOLEY 2WAY  5CC 16FR (CATHETERS) ×1
CATH URTH 16FR FL 2W BLN LF (CATHETERS) ×1 IMPLANT
CORD MONOPOLAR M/FML 12FT (MISCELLANEOUS) ×1 IMPLANT
COUNTER NEEDLE 20/40 LG (NEEDLE) ×1 IMPLANT
DERMABOND ADVANCED .7 DNX12 (GAUZE/BANDAGES/DRESSINGS) ×1 IMPLANT
DEVICE SUTURE ENDOST 10MM (ENDOMECHANICALS) IMPLANT
DRSG TEGADERM 2-3/8X2-3/4 SM (GAUZE/BANDAGES/DRESSINGS) ×3 IMPLANT
GAUZE 4X4 16PLY ~~LOC~~+RFID DBL (SPONGE) ×1 IMPLANT
GLOVE BIO SURGEON STRL SZ7 (GLOVE) ×3 IMPLANT
GLOVE SURG UNDER LTX SZ7.5 (GLOVE) ×2 IMPLANT
GOWN STRL REUS W/ TWL LRG LVL3 (GOWN DISPOSABLE) ×3 IMPLANT
GOWN STRL REUS W/TWL LRG LVL3 (GOWN DISPOSABLE) ×3
GOWN STRL REUS W/TWL XL LVL4 (GOWN DISPOSABLE) IMPLANT
GRASPER SUT TROCAR 14GX15 (MISCELLANEOUS) ×1 IMPLANT
HEMOSTAT ARISTA ABSORB 3G PWDR (HEMOSTASIS) IMPLANT
IRRIGATION STRYKERFLOW (MISCELLANEOUS) IMPLANT
IRRIGATOR STRYKERFLOW (MISCELLANEOUS) ×1
IV NS 1000ML (IV SOLUTION)
IV NS 1000ML BAXH (IV SOLUTION) IMPLANT
KIT PINK PAD W/HEAD ARE REST (MISCELLANEOUS) ×1
KIT PINK PAD W/HEAD ARM REST (MISCELLANEOUS) ×1 IMPLANT
KIT TURNOVER CYSTO (KITS) ×1 IMPLANT
L-HOOK LAP DISP 36CM (ELECTROSURGICAL)
LABEL OR SOLS (LABEL) ×1 IMPLANT
LHOOK LAP DISP 36CM (ELECTROSURGICAL) IMPLANT
LIGASURE VESSEL 5MM BLUNT TIP (ELECTROSURGICAL) IMPLANT
MANIFOLD NEPTUNE II (INSTRUMENTS) ×1 IMPLANT
MANIPULATOR VCARE LG CRV RETR (MISCELLANEOUS) IMPLANT
MANIPULATOR VCARE STD CRV RETR (MISCELLANEOUS) IMPLANT
NS IRRIG 500ML POUR BTL (IV SOLUTION) ×1 IMPLANT
OCCLUDER COLPOPNEUMO (BALLOONS) ×1 IMPLANT
PACK GYN LAPAROSCOPIC (MISCELLANEOUS) ×1 IMPLANT
PAD OB MATERNITY 4.3X12.25 (PERSONAL CARE ITEMS) ×1 IMPLANT
PAD PREP 24X41 OB/GYN DISP (PERSONAL CARE ITEMS) ×1 IMPLANT
PENCIL SMOKE EVACUATOR (MISCELLANEOUS) IMPLANT
SCISSORS METZENBAUM CVD 33 (INSTRUMENTS) IMPLANT
SCRUB CHG 4% DYNA-HEX 4OZ (MISCELLANEOUS) ×1 IMPLANT
SET CYSTO W/LG BORE CLAMP LF (SET/KITS/TRAYS/PACK) IMPLANT
SET TRI-LUMEN FLTR TB AIRSEAL (TUBING) IMPLANT
SLEEVE Z-THREAD 5X100MM (TROCAR) ×1 IMPLANT
SPONGE GAUZE 2X2 8PLY STRL LF (GAUZE/BANDAGES/DRESSINGS) ×2 IMPLANT
SUT ENDO VLOC 180-0-8IN (SUTURE) IMPLANT
SUT MNCRL 4-0 (SUTURE) ×1
SUT MNCRL 4-0 27XMFL (SUTURE) ×1
SUT VIC AB 0 CT2 27 (SUTURE) ×2 IMPLANT
SUT VICRYL 0 AB UR-6 (SUTURE) IMPLANT
SUTURE MNCRL 4-0 27XMF (SUTURE) ×1 IMPLANT
SYR 10ML LL (SYRINGE) ×1 IMPLANT
SYR 50ML LL SCALE MARK (SYRINGE) ×1 IMPLANT
TRAP FLUID SMOKE EVACUATOR (MISCELLANEOUS) ×1 IMPLANT
TROCAR PORT AIRSEAL 5X120 (TROCAR) IMPLANT
TROCAR XCEL NON-BLD 5MMX100MML (ENDOMECHANICALS) ×1 IMPLANT
TROCAR Z-THRD FIOS HNDL 11X100 (TROCAR) ×1 IMPLANT
TUBING EVAC SMOKE HEATED PNEUM (TUBING) ×1 IMPLANT
WATER STERILE IRR 500ML POUR (IV SOLUTION) ×1 IMPLANT

## 2022-07-23 NOTE — Progress Notes (Signed)
Patient awake/alert x4. Tolerated gingerale and crackers without event.

## 2022-07-23 NOTE — Anesthesia Preprocedure Evaluation (Addendum)
Anesthesia Evaluation  Patient identified by MRN, date of birth, ID band Patient awake    Reviewed: Allergy & Precautions, NPO status , Patient's Chart, lab work & pertinent test results  History of Anesthesia Complications Negative for: history of anesthetic complications  Airway Mallampati: I   Neck ROM: Full    Dental no notable dental hx.    Pulmonary neg pulmonary ROS,    Pulmonary exam normal breath sounds clear to auscultation       Cardiovascular Exercise Tolerance: Good negative cardio ROS Normal cardiovascular exam Rhythm:Regular Rate:Normal     Neuro/Psych negative neurological ROS     GI/Hepatic negative GI ROS,   Endo/Other  negative endocrine ROS  Renal/GU Renal disease (nephrolithiasis)     Musculoskeletal   Abdominal   Peds  Hematology  (+) Blood dyscrasia, anemia ,   Anesthesia Other Findings   Reproductive/Obstetrics                            Anesthesia Physical Anesthesia Plan  ASA: 2  Anesthesia Plan: General   Post-op Pain Management:    Induction: Intravenous  PONV Risk Score and Plan: 3 and Ondansetron, Dexamethasone and Treatment may vary due to age or medical condition  Airway Management Planned: Oral ETT  Additional Equipment:   Intra-op Plan:   Post-operative Plan: Extubation in OR  Informed Consent: I have reviewed the patients History and Physical, chart, labs and discussed the procedure including the risks, benefits and alternatives for the proposed anesthesia with the patient or authorized representative who has indicated his/her understanding and acceptance.     Dental advisory given  Plan Discussed with: CRNA  Anesthesia Plan Comments: (Patient consented for risks of anesthesia including but not limited to:  - adverse reactions to medications - damage to eyes, teeth, lips or other oral mucosa - nerve damage due to positioning  -  sore throat or hoarseness - damage to heart, brain, nerves, lungs, other parts of body or loss of life  Informed patient about role of CRNA in peri- and intra-operative care.  Patient voiced understanding.)        Anesthesia Quick Evaluation

## 2022-07-23 NOTE — Anesthesia Procedure Notes (Signed)
Procedure Name: Intubation Date/Time: 07/23/2022 7:59 AM  Performed by: Aline Brochure, CRNAPre-anesthesia Checklist: Patient identified, Patient being monitored, Timeout performed, Emergency Drugs available and Suction available Patient Re-evaluated:Patient Re-evaluated prior to induction Oxygen Delivery Method: Circle system utilized Preoxygenation: Pre-oxygenation with 100% oxygen Induction Type: IV induction Ventilation: Mask ventilation without difficulty Laryngoscope Size: 3 and McGraph Grade View: Grade I Tube type: Oral Tube size: 7.0 mm Number of attempts: 1 Airway Equipment and Method: Stylet and Video-laryngoscopy Placement Confirmation: ETT inserted through vocal cords under direct vision, positive ETCO2 and breath sounds checked- equal and bilateral Secured at: 21 cm Tube secured with: Tape Dental Injury: Teeth and Oropharynx as per pre-operative assessment

## 2022-07-23 NOTE — Discharge Instructions (Addendum)
Discharge instructions after   total laparoscopic hysterectomy   For the next three days, take ibuprofen and acetaminophen on a schedule, every 8 hours. You can take them together or you can intersperse them, and take one every four hours. I also gave you gabapentin for nighttime, to help you sleep and also to control pain. Take gabapentin medicines at night for at least the next 3 nights. You also have a narcotic, oxycodone, to take as needed if the above medicines don't help.  Postop constipation is a major cause of pain. Stay well hydrated, walk as you tolerate, and take over the counter senna as well as stool softeners if you need them.    Signs and Symptoms to Report Call our office at [phone] if you have any of the following.   Fever over 100.4 degrees or higher  Severe stomach pain not relieved with pain medications  Bright red bleeding that's heavier than a period that does not slow with rest  To go the bathroom a lot (frequency), you can't hold your urine (urgency), or it hurts when you empty your bladder (urinate)  Chest pain  Shortness of breath  Pain in the calves of your legs  Severe nausea and vomiting not relieved with anti-nausea medications  Signs of infection around your wounds, such as redness, hot to touch, swelling, green/yellow drainage (like pus), bad smelling discharge  Any concerns  What You Can Expect after Surgery  You may see some pink tinged, bloody fluid and bruising around the wound. This is normal.  You may notice shoulder and neck pain. This is caused by the gas used during surgery to expand your abdomen so your surgeon could get to the uterus easier.  You may have a sore throat because of the tube in your mouth during general anesthesia. This will go away in 2 to 3 days.  You may have some stomach cramps.  You may notice spotting on your panties.  You may have pain around the incision sites.   Activities after Your Discharge Follow these  guidelines to help speed your recovery at home:  Do the coughing and deep breathing as you did in the hospital for 2 weeks. Use the small blue breathing device, called the incentive spirometer for 2 weeks.  Don't drive if you are in pain or taking narcotic pain medicine. You may drive when you can safely slam on the brakes, turn the wheel forcefully, and rotate your torso comfortably. This is typically 1-2 weeks. Practice in a parking lot or side street prior to attempting to drive regularly.   Ask others to help with household chores for 4 weeks.  Do not lift anything heavier that 10 pounds for 4-6 weeks. This includes pets, children, and groceries.  Don't do strenuous activities, exercises, or sports like vacuuming, tennis, squash, etc. until your doctor says it is safe to do so. ---Maintain pelvic rest for 8 weeks. This means nothing in the vagina or rectum at all (no douching, tampons, intercourse) for 8 weeks.   Walk as you feel able. Rest often since it may take two or three weeks for your energy level to return to normal.   You may climb stairs  Avoid constipation:   -Eat fruits, vegetables, and whole grains. Eat small meals as your appetite will take time to return to normal.   -Drink 6 to 8 glasses of water each day unless your doctor has told you to limit your fluids.   -Use a laxative  or stool softener as needed if constipation becomes a problem. You may take Miralax, metamucil, Citrucil, Colace, Senekot, FiberCon, etc. If this does not relieve the constipation, try two tablespoons of Milk Of Magnesia every 8 hours until your bowels move.   You may shower. Gently wash the wounds with a mild soap and water. Pat dry.  Do not get in a hot tub, swimming pool, etc. for 6 weeks.  Do not use lotions, oils, powders on the wounds.  Do not douche, use tampons, or have sex until your doctor says it is okay.  Take your pain medicine when you need it. The medicine may not work as well if the pain is  bad.  Take the medicines you were taking before surgery. Other medications you will need are pain medications and possibly constipation and nausea medications (Zofran). Here is a helpful article from the website [employer], regarding constipation  Here are reasons why constipation occurs after surgery: 1) During the operation and in the recovery room, most people are given opioid pain medication, primarily through an IV, to treat moderate or severe pain. Intravenous opioids include morphine, Dilaudid and fentanyl. After surgery, patients are often prescribed opioid pain medication to take by mouth at home, including codeine, Vicodin, Norco, and Percocet. All of these medications cause constipation by slowing down the movement of your intestine. 2) Changes in your diet before surgery can be another culprit. It is common to get specific instructions to change how you normally eat or drink before your surgery, like only having liquids the day before or not having anything to eat or drink after midnight the night before surgery. For this reason, temporary dehydration may occur. This, along with not eating or only having liquids, means that you are getting less fiber than usual. Both these factors contribute to constipation. 3) Changes in your diet after surgery can also contribute to the problem. Although many people don't have dietary restrictions after operations, being under anesthesia can make you lose your appetite for several hours and maybe even days. Some people can even have nausea or vomiting. Not eating or drinking normally means that you are not getting enough fiber and you can get dehydrated, both leading to constipation. 4) Lying in a bed more than usual--which happens before, during and after surgery--combined with the medications and diet changes, all work together to slow down your colon and make your poop turn to rock.  No one likes to be constipated.  Let's face it, it's not a pleasant  feeling when you don't poop for days, then strain on the toilet to finally pass something large enough to cause damage. An ounce of prevention is worth a pound of cure, so: Assume you will be constipated. Plan and prepare accordingly. Post-surgery is one of those unique situations where the temporary use of laxatives can make a world of difference. Always consult with your doctor, and recognize that if you wait several days after surgery to take a laxative, the constipation might be too severe for these over-the-counter options. It is always important to discuss all medications you plan on taking with your doctor. Ask your doctor if you can start the laxative immediately after surgery. *  Here are go-to post-surgery laxatives: Senna: Senna is an herb that acts as a "stimulant laxative," meaning it increases the activity of the intestine to cause you to have a bowel movement. It comes in many forms, but senna pills are easy to take and are sold over the counter  at almost all pharmacies. Since opioid pain medications slow down the activity of the intestine, it makes sense to take a medication to help reverse that side effect. Long-term use of a stimulant laxative is not a good idea since it can make your colon "lazy" and not function properly; however, temporary use immediately after surgery is acceptable. In general, if you are able to eat a normal diet, taking senna soon after surgery works the best. Senna usually works within hours to produce a bowel movement, but this is less predictable when you are taking different medications after surgery. Try not to wait several days to start taking senna, as often it is too late by then. Just like with all medications or supplements, check with your doctor before starting new treatment.   Magnesium: Magnesium is an important mineral that our body needs. We get magnesium from some foods that we eat, especially foods that are high in fiber such as broccoli, almonds and  whole grains. There are also magnesium-based medications used to treat constipation including milk of magnesia (magnesium hydroxide), magnesium citrate and magnesium oxide. They work by drawing water into the intestine, putting it into the class of "osmotic" laxatives. Magnesium products in low doses appear to be safe, but if taken in very large doses, can lead to problems such as irregular heartbeat, low blood pressure and even death. It can also affect other medications you might be taking, therefore it is important to discuss using magnesium with your physician and pharmacist before initiating therapy. Most over-the-counter magnesium laxatives work very well to help with the constipation related to surgery, but sometimes they work too well and lead to diarrhea. Make sure you are somewhere with easy access to a bathroom, just in case.   Bisacodyl: Bisacodyl (generic name) is sold under brand names such as Dulcolax. Much like senna, it is a "stimulant laxative," meaning it makes your intestines move more quickly to push out the stool. This is another good choice to start taking as soon as your doctor says you can take a laxative after surgery. It comes in pill form and as a suppository, which is a good choice for people who cannot or are not allowed to swallow pills. Studies have shown that it works as a laxative, but like most of these medications, you should use this on a short-term basis only.   Enema: Enemas strike fear in many people, but FEAR NOT! It's nowhere near as big a deal as you may think. An enema is just a way to get some liquid into your rectum by placing a specially designed device through your anus. If you have never done one, it might seem like a painful, unpleasant, uncomfortable, complicated and lengthy procedure. But in reality, it's simple, takes just a few seconds and is highly effective. The small ready-made bottles you buy at the pharmacy are much easier than the hose/large rubber  container type. Those recommended positions illustrated in some instructions are generally not necessary to place the enema. It's very similar to the insertion of a tampon, requiring a slight squat. Some extra lubrication on the enema's tip (or on your anus) will make it a breeze. In certain cases, there is no substitute for a good enema. For example, if someone has not pooped for a few days, the beginning of the poop waiting to come out can become rock hard. Passing that hard stool can lead to much pain and problems like anal fissures. Inserting a little liquid to break  up the rock-hard stool will help make its passage much easier. Enemas come with different liquids. Most come with saline, but there are also mineral oil options. You can also use warm water in the reusable enema containers. They all work. But since saline can sometimes be irritating, so try a mineral oil or water enema instead.  Here are commonly recommended constipation medications that do not work well for post-surgery constipation: Docusate: Docusate (generic name) most commonly referred to as Colace (brand name) is not really a laxative, but is classified as a stool softener. Although this medication is commonly prescribed, it is not recommended for several reasons: 1) there is no good medical evidence that it works 2) even if it has an effect, which is very questionable, it is minimal and cannot combat the intestinal slowing caused by the opioid medications. Skip docusate to save money and space in your pillbox for something more effective.  PEG: Miralax (brand name) is basically a chemical called polyethylene glycol (PEG) and it has gained tremendous popularity as a laxative. This product is an "osmotic laxative" meaning it works by pulling water into the stool, making it softer. This is very similar to the action of natural fiber in foods and supplements. Therefore, the effect seen by this medication is not immediate, causing a bowel  movement in a day or more. Is this medication strong enough to battle the constipation related to having an operation? Maybe for some people not prone to constipation. But for most people, other laxatives are better to prevent constipation after surgery.  AMBULATORY SURGERY  DISCHARGE INSTRUCTIONS   The drugs that you were given will stay in your system until tomorrow so for the next 24 hours you should not:  Drive an automobile Make any legal decisions Drink any alcoholic beverage   You may resume regular meals tomorrow.  Today it is better to start with liquids and gradually work up to solid foods.  You may eat anything you prefer, but it is better to start with liquids, then soup and crackers, and gradually work up to solid foods.   Please notify your doctor immediately if you have any unusual bleeding, trouble breathing, redness and pain at the surgery site, drainage, fever, or pain not relieved by medication    Please contact your physician with any problems or Same Day Surgery at [phone], Monday through Friday 6 am to 4 pm, or [place] at [facility] number at [phone].

## 2022-07-23 NOTE — Anesthesia Postprocedure Evaluation (Signed)
Anesthesia Post Note  Patient: Frances Barnes  Procedure(s) Performed: TOTAL LAPAROSCOPIC HYSTERECTOMY WITH SALPINGECTOMY (Bilateral: Vagina )  Patient location during evaluation: PACU Anesthesia Type: General Level of consciousness: awake and alert, oriented and patient cooperative Pain management: pain level controlled Vital Signs Assessment: post-procedure vital signs reviewed and stable Respiratory status: spontaneous breathing, nonlabored ventilation and respiratory function stable Cardiovascular status: blood pressure returned to baseline and stable Postop Assessment: adequate PO intake Anesthetic complications: no   No notable events documented.   Last Vitals:  Vitals:   07/23/22 1048 07/23/22 1052  BP:    Pulse: 72 91  Resp: 11 12  Temp:    SpO2: 99% 100%    Last Pain:  Vitals:   07/23/22 1055  TempSrc:   PainSc: [name]

## 2022-07-23 NOTE — Op Note (Signed)
Frances Barnes PROCEDURE DATE: 07/23/2022  PREOPERATIVE DIAGNOSIS: Menometrorrhagia POSTOPERATIVE DIAGNOSIS: The same PROCEDURE:  TOTAL LAPAROSCOPIC HYSTERECTOMY WITH SALPINGECTOMY: 99833 (CPT) Cystoscopy  SURGEON:  Dr. Benjaman Kindler, MD ASSISTANT: Dr. Prentice Docker, MD Anesthesiologist:  Anesthesiologist: Darrin Nipper, MD CRNA: Aline Brochure, CRNA; Hedda Slade, CRNA  INDICATIONS: 36 y.o. F  here for definitive surgical management secondary to the indications listed under preoperative diagnoses; please see preoperative note for further details.  Risks of surgery were discussed with the patient including but not limited to: bleeding which may require transfusion or reoperation; infection which may require antibiotics; injury to bowel, bladder, ureters or other surrounding organs; need for additional procedures; thromboembolic phenomenon, incisional problems and other postoperative/anesthesia complications. Written informed consent was obtained.    FINDINGS:  External genitalia negative for lesions. Vagina negative. Adnexa negative for masses or nodularity. Cervix without gross lesions. Uterus mobile, anteverted, boggy-appearing, small.   Intraoperative findings revealed a normal upper abdomen including bowel, diaphragmatic surfaces, stomach, and omentum. Spleen was visible. Liver with gallbladder appeared normal consistency and appearance but with large non-edematous gall bladder  The uterus was small and mobile.  The right and left ovaries appeared normal.  Bilateral tubes appeared normal.  Normal appendix   ANESTHESIA:    General INTRAVENOUS FLUIDS:1000  ml ESTIMATED BLOOD LOSS:50 ml URINE OUTPUT: 300 ml   SPECIMENS: Uterus, cervix, bilateral fallopian tubes  COMPLICATIONS: None immediate  PROCEDURE IN DETAIL:  The patient received prophalactic intravenous antibiotics and had sequential compression devices applied to her lower extremities while in the preoperative  area.  She was then taken to the operating room where general anesthesia was administered and was found to be adequate.  She was placed in the dorsal lithotomy position, and was prepped and draped in a sterile manner.  A formal time out was performed with all team members present and in agreement.  A V-care uterine manipulator was placed at this time.  A Foley catheter was inserted into her bladder and attached to constant drainage. Attention was turned to the abdomen and 0.5% Marcaine infused subq. A 38m umbilical incision was made with the scalpel.  The Optiview 5-mm trocar and sleeve were then advanced without difficulty with the laparoscope under direct visualization into the abdomen.  The abdomen was then insufflated with carbon dioxide gas and adequate pneumoperitoneum was obtained.  A survey of the patient's pelvis and abdomen revealed the findings above.  Bilateral lower quadrant ports (8 mm on the right and 11 mm on the left) were then placed under direct visualization.  The pelvis was then carefully examined.  Attention was turned to the fallopian tubes; these were freed from the underlying mesosalpinx and the uterine attachments using the Ligasure device.  The bilateral round and broad ligaments were then clamped and transected with the Ligasure device.  The uterine artery was then skeletonized and a bladder flap was created.  The ureters were noted to be safely away from the area of dissection.  The bladder was then bluntly dissected off the lower uterine segment.    At this point, attention was turned to the uterine vessels, which were clamped and cauterized using the Ligasure on the left, and then the right. After the uterine blood flow at the level of the internal os was controlled, both arteries were cut with the Ligasure.  Good hemostasis was noted overall.  The uterosacral and cardinal ligaments were clamped, cut and ligated bilaterally .  Attention was then turned to the cervicovaginal  junction, and monopolar  scissors were used to transect the cervix from the surrounding vagina using the ring of the V-care as a guide. This was done circumferentially allowing total hysterectomy.  The uterus was then removed from the vagina. A portion of the anterior cervix was still attached, and this was removed with scissors, after carefully developing the bladder flap. From above, the vaginal cuff incision was then closed with running V-loc suture.  Overall excellent hemostasis was noted.  A second layer to assure peritoneal closure over the cuff and that the uterosacral ligaments were affixed firmly was undertaken with the V-lock. Hemostasis was excellent.  Attention was returned to the abdomen.The ureters were reexamined bilaterally and were pulsating normally. The abdominal pressure was reduced and hemostasis was confirmed.   Intravenous floruoceine was administered, and cystoscopy showed bilateral ureteral jets.  No stitches were visualized in the bladder during cystoscopy.  The 69m port fascia was closed with a vertical mattress with 0-Vicryl, using the cone closure system. All trocars were removed under direct visualization, and the abdomen was desufflated.  All skin incisions were closed with 4-0 Monocryl subcuticular stitches and Dermabond. The patient tolerated the procedures well.  All instruments, needles, and sponge counts were correct x 2. The patient was taken to the recovery room awake, extubated and in stable condition.

## 2022-07-23 NOTE — Transfer of Care (Signed)
Immediate Anesthesia Transfer of Care Note  Patient: Frances Barnes  Procedure(s) Performed: TOTAL LAPAROSCOPIC HYSTERECTOMY WITH SALPINGECTOMY (Bilateral: Vagina )  Patient Location: PACU  Anesthesia Type:General  Level of Consciousness: drowsy  Airway & Oxygen Therapy: Patient Spontanous Breathing and Patient connected to face mask oxygen  Post-op Assessment: Report given to RN and Post -op Vital signs reviewed and stable  Post vital signs: Reviewed and stable  Last Vitals:  Vitals Value Taken Time  BP 97/56   Temp    Pulse 86 07/23/22 1024  Resp 16 07/23/22 1024  SpO2 100 % 07/23/22 1024  Vitals shown include unvalidated device data.  Last Pain:  Vitals:   07/23/22 0655  TempSrc: Temporal  PainSc: 0-No pain         Complications: No notable events documented.

## 2022-07-23 NOTE — Interval H&P Note (Signed)
History and Physical Interval Note:  07/23/2022 7:40 AM  Frances Barnes  has presented today for surgery, with the diagnosis of Menorrhagia.  The various methods of treatment have been discussed with the patient and family. After consideration of risks, benefits and other options for treatment, the patient has consented to  Procedure(s): TOTAL LAPAROSCOPIC HYSTERECTOMY WITH SALPINGECTOMY (Bilateral) as a surgical intervention.  The patient's history has been reviewed, patient examined, no change in status, stable for surgery.  I have reviewed the patient's chart and labs.  Questions were answered to the patient's satisfaction.     Benjaman Kindler

## 2022-07-24 ENCOUNTER — Encounter: Payer: Self-pay | Admitting: Obstetrics and Gynecology

## 2022-07-24 LAB — SURGICAL PATHOLOGY

## 2022-08-29 IMAGING — CT CT ABD-PEL WO/W CM
2 of 9 series · 11 of 46 positions shown, 16 images · IV contrast (omnipaque)
Comparison: None.

CLINICAL DATA: Symptomatic superficial varicosities of the vulvar
region with communication to the saphenous veins bilaterally by
prior duplex ultrasound. There also is a potential component of
ovarian vein insufficiency with pelvic congestion syndrome given
pelvic symptoms and further CT evaluation is performed prior to
potential treatment.

EXAM:
CT ABDOMEN AND PELVIS WITHOUT AND WITH CONTRAST
TECHNIQUE: Multidetector CT imaging of the abdomen and pelvis was performed
following the standard protocol before and following the bolus
administration of intravenous contrast.
CONTRAST:  100mL OMNIPAQUE IOHEXOL 300 MG/ML  SOLN

[Series 4: abd/pel post · axial · 0.81mm/px · z∈[-560,-186]mm · 8 of 97 slices shown, 13 images]
[im 11/97  soft-tissue]
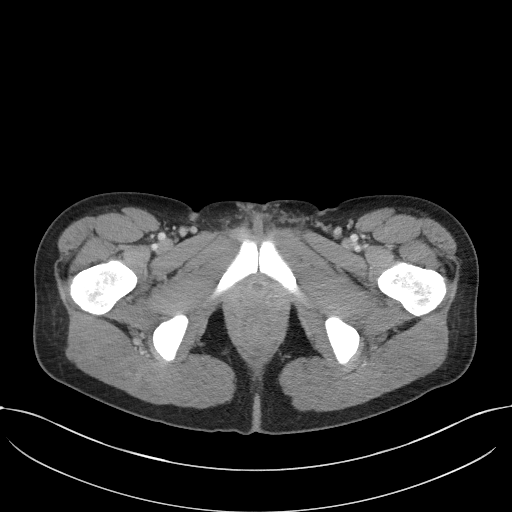
[im 11/97  bone]
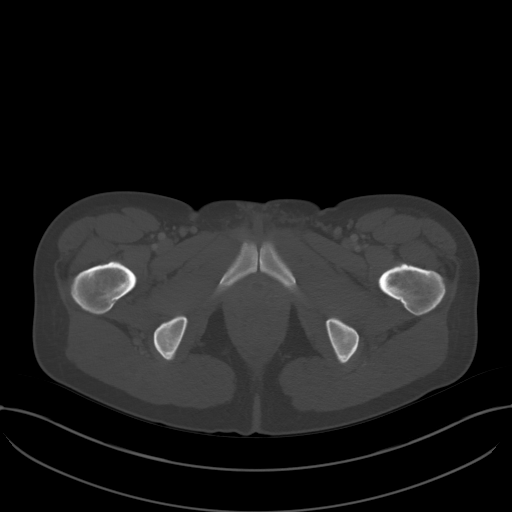
[im 22/97  soft-tissue]
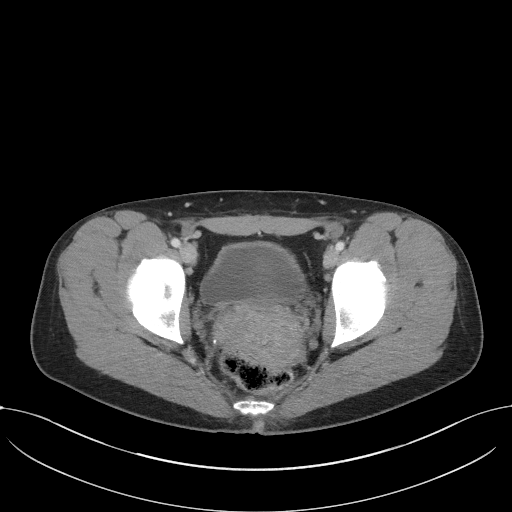
[im 33/97  soft-tissue]
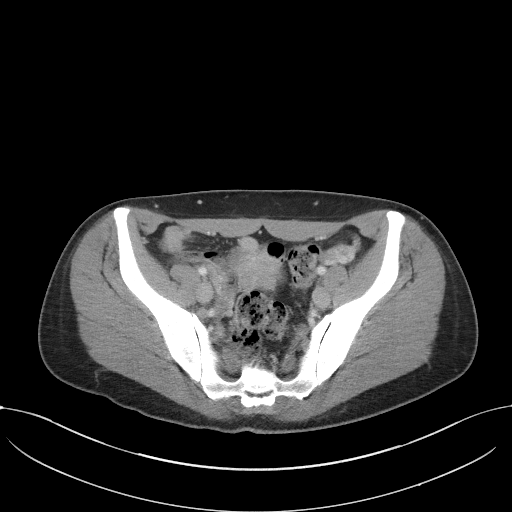
[im 43/97  soft-tissue]
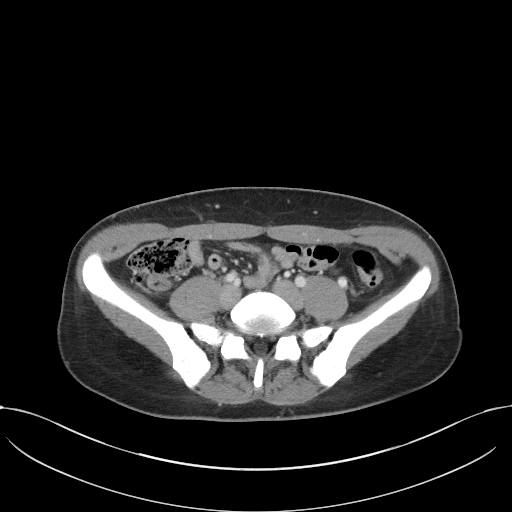
[im 54/97  soft-tissue]
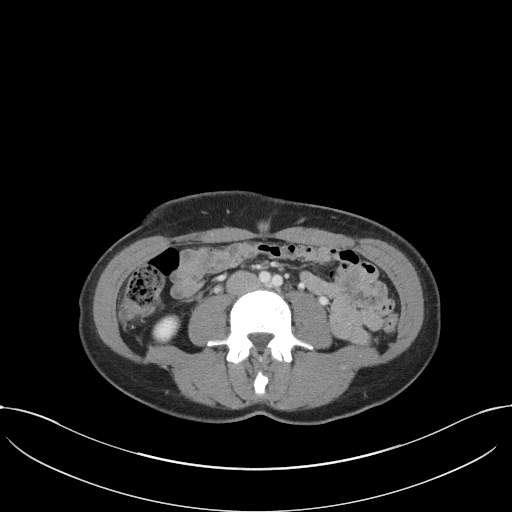
[im 54/97  lung]
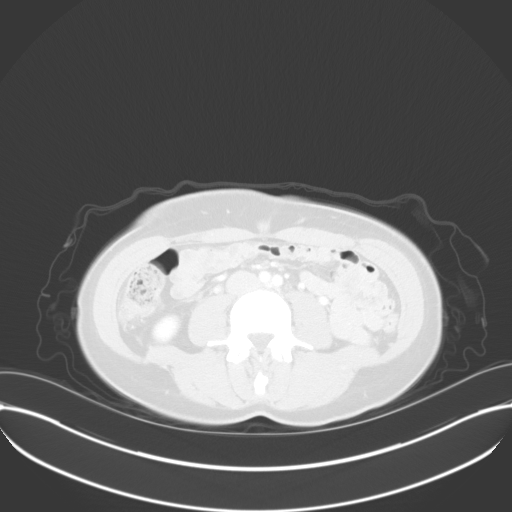
[im 65/97  soft-tissue]
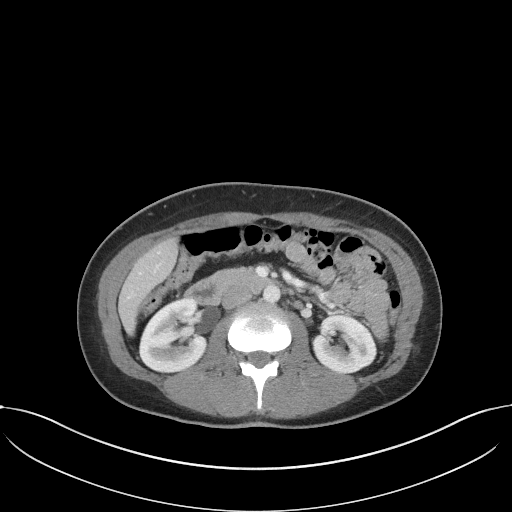
[im 65/97  lung]
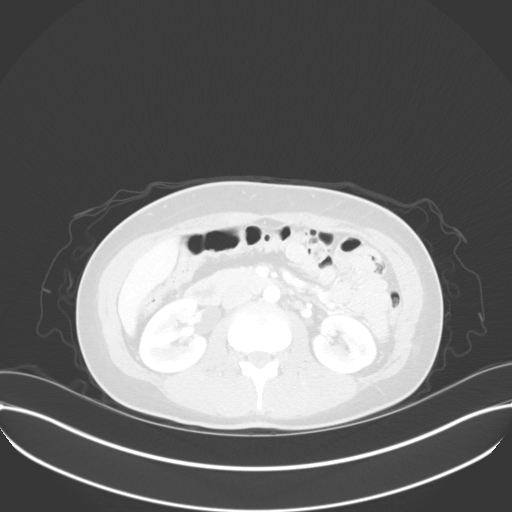
[im 75/97  soft-tissue]
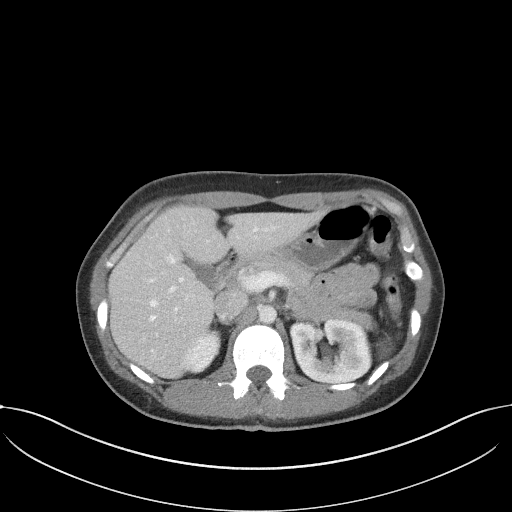
[im 75/97  lung]
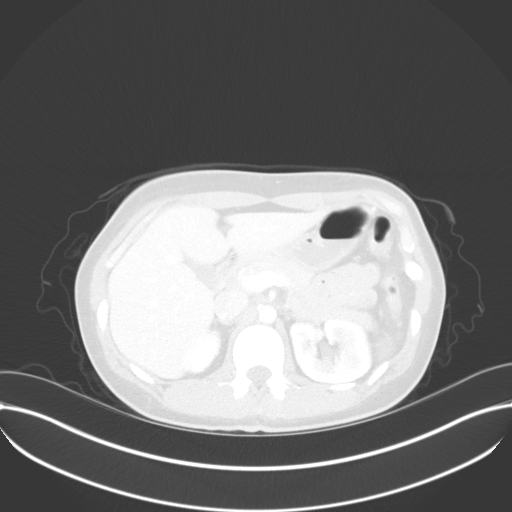
[im 86/97  soft-tissue]
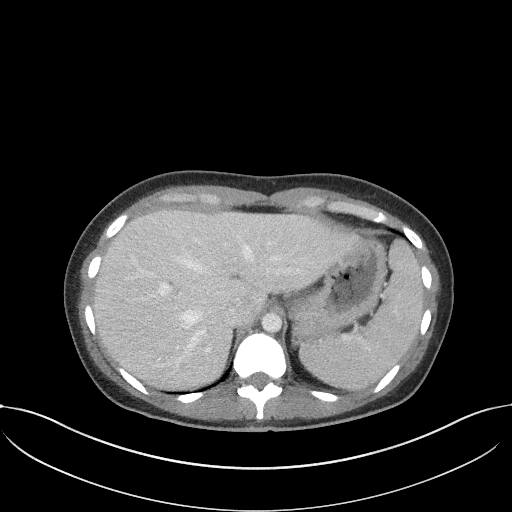
[im 86/97  lung]
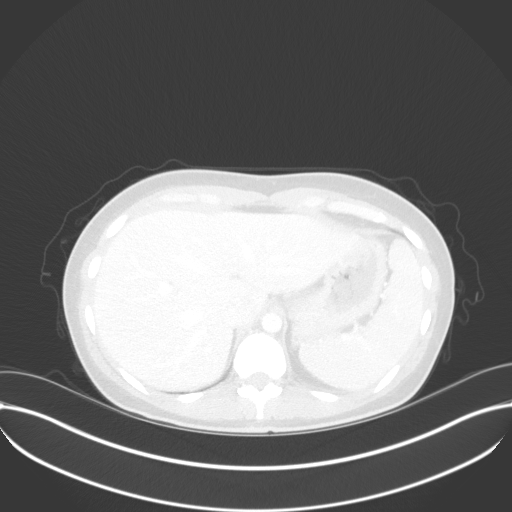

[Series 8: coronal st · coronal · 0.66mm/px · 3 of 74 slices shown]
[im 19/74  soft-tissue]
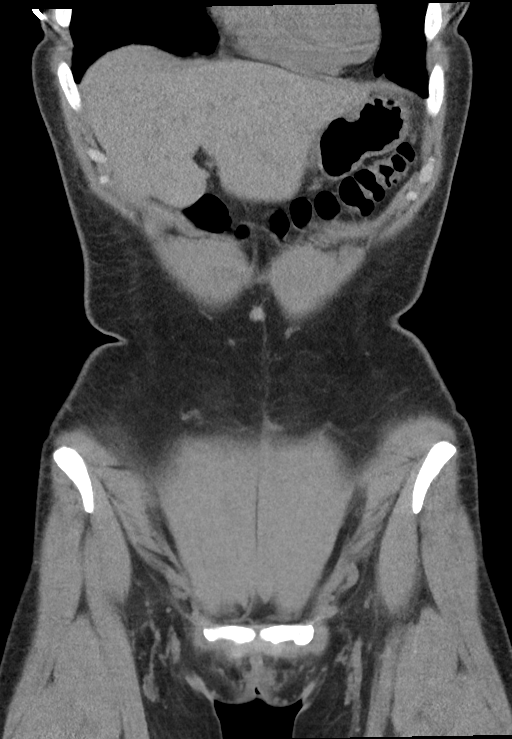
[im 37/74  soft-tissue]
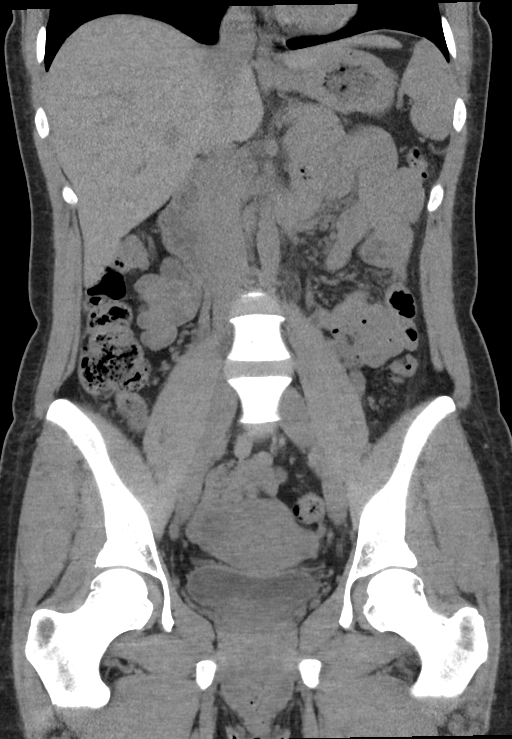
[im 55/74  soft-tissue]
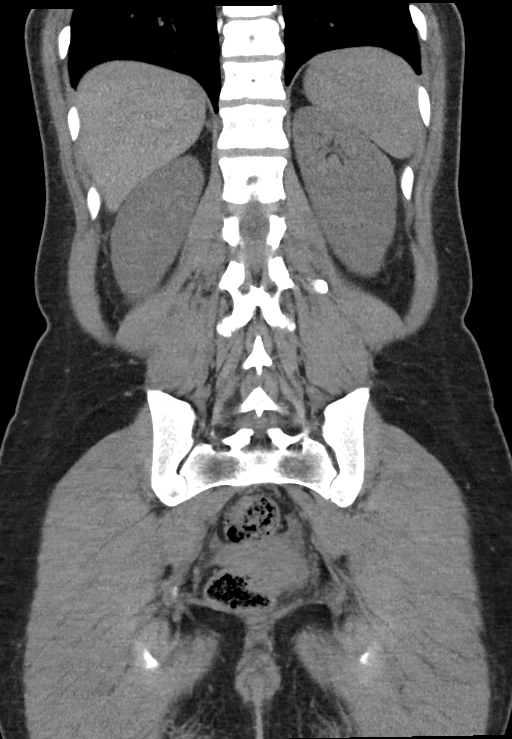

[11 of 46 positions shown; findings below may reference images not displayed]

FINDINGS: Lower chest: No acute abnormality.

Hepatobiliary: No focal liver abnormality is seen. No gallstones,
gallbladder wall thickening, or biliary dilatation.

Pancreas: Unremarkable. No pancreatic ductal dilatation or
surrounding inflammatory changes.

Spleen: Normal in size without focal abnormality.

Adrenals/Urinary Tract: Adrenal glands are normal. Punctate
nonobstructing renal calculi bilaterally measuring 2 mm or less in
size. No hydronephrosis or renal masses. The bladder is
unremarkable.

Stomach/Bowel: Bowel shows no evidence of obstruction, ileus,
inflammation or lesion. No free air identified. The appendix is
normal.

Vascular/Lymphatic: No enlarged abdominal or pelvic lymph nodes.

Both ovarian veins are visualized by CT. The left ovarian vein
measures 7-8 mm in greatest caliber and the right ovarian vein
measures 6 mm in greatest caliber. Both ovarian veins can be
followed to the level of the ovaries and there are no significant
associated para-ovarian or para-uterine varicosities.

Prominent superficial veins are noted in the region of the mons
pubis in the midline and to both sides of the midline and extending
to the region of the vulva. The superficial veins do not appear to
communicate with branches of the ovarian veins, internal iliac veins
or inferior epigastric veins. These presumably communicate with
superficial saphenous veins of the proximal lower extremities
bilaterally.

Reproductive: Uterus and bilateral adnexa are unremarkable. Small
amount of fluid in the endometrial cavity. No adnexal masses.

Other: No abdominal wall hernia or abnormality. No abdominopelvic
ascites.

Musculoskeletal: No acute or significant osseous findings.
IMPRESSION: 1. Mildly prominent caliber of bilateral ovarian veins, left greater
than right. However, the ovarian veins do not communicate with any
significant visualized para-ovarian or para-uterine pelvic
varicosities.
2. Prominent superficial veins of the mons pubis and vulva do not
appear to communicate with the ovarian veins, internal iliac veins
or inferior epigastric veins. These therefore presumably communicate
with superficial saphenous system of the proximal lower extremities
bilaterally.
3. Punctate nonobstructing bilateral renal calculi measuring 2 mm or
less.

## 2022-09-27 IMAGING — US US EXTREM LOW VENOUS
1 series · 12 of 24 positions shown · non-contrast
Comparison: None.

CLINICAL DATA: Bilateral lower extremity venous insufficiency with
symptomatic thigh and calf varicosities as well as varicosities on
both size of the midline in the vulvar and labial regions.

EXAM:
BILATERAL LOWER EXTREMITY VENOUS DUPLEX ULTRASOUND
TECHNIQUE: Gray-scale sonography with graded compression, as well as color
Doppler and duplex ultrasound, were performed to evaluate the deep
and superficial veins of both lower extremities. Spectral Doppler
was utilized to evaluate flow at rest and with distal augmentation
maneuvers. A complete superficial venous insufficiency exam was
performed in the upright standing. I personally performed the
technical portion of the exam.

[Series 1: us extrem low venous · 0.07mm/px · 12 of 91 slices shown]
[im 4/91]
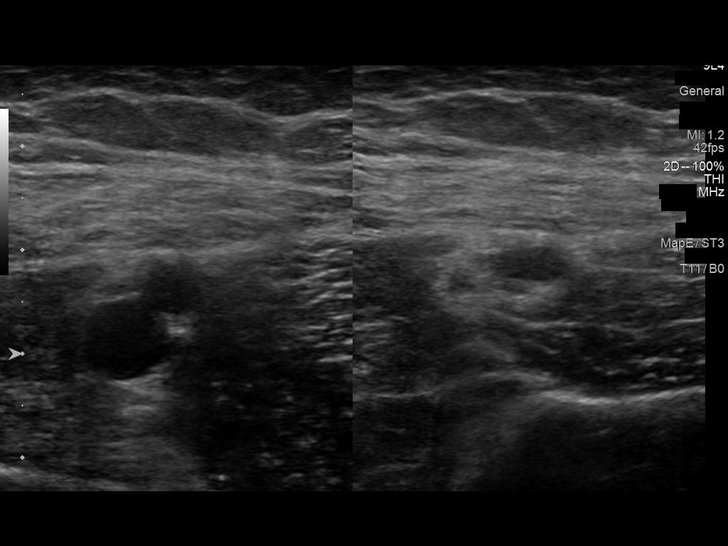
[im 12/91]
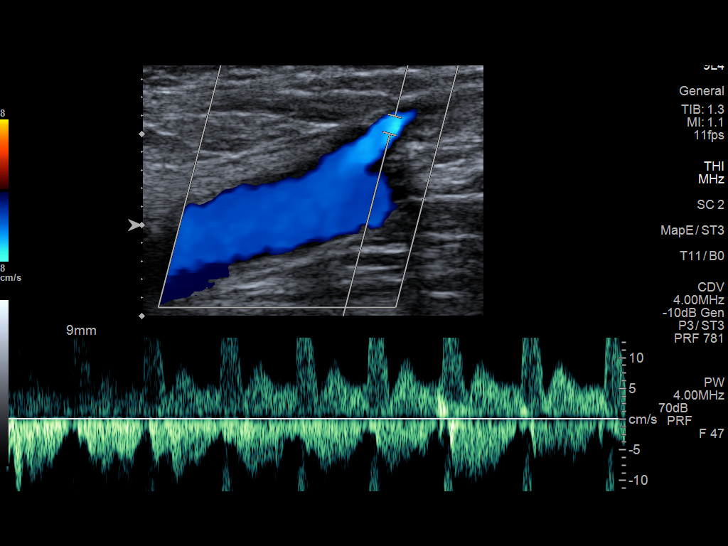
[im 20/91]
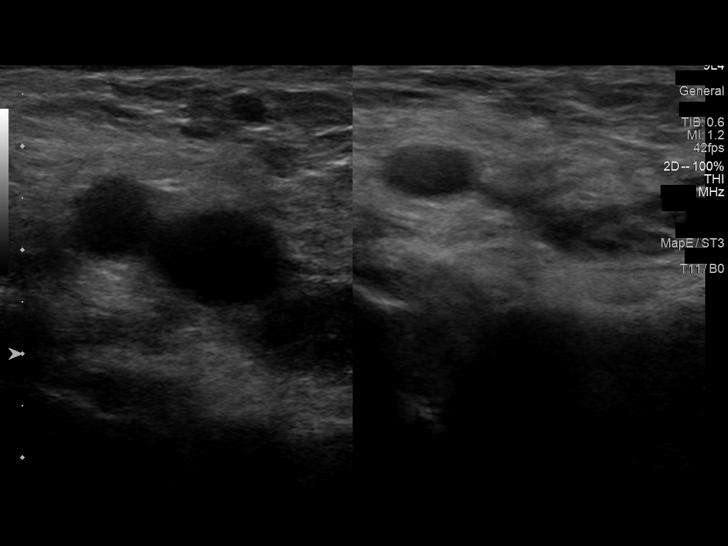
[im 28/91]
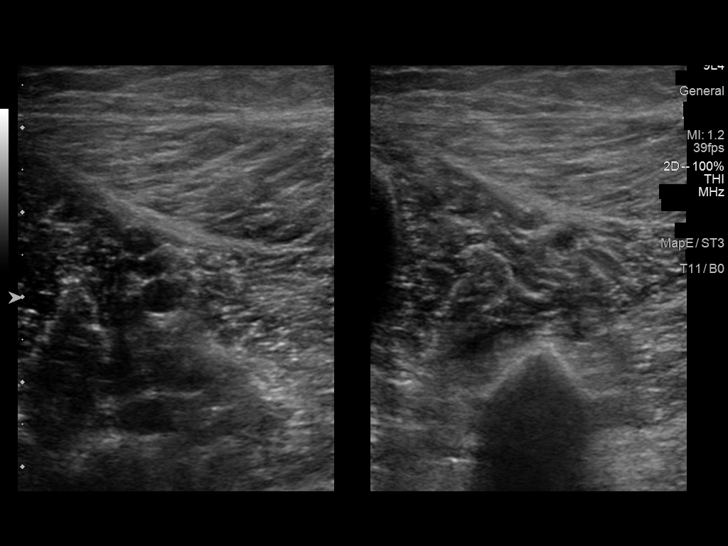
[im 36/91]
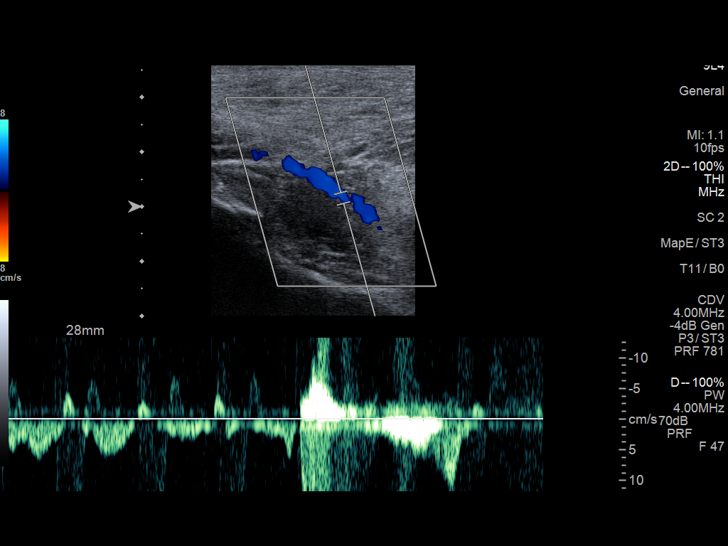
[im 44/91]
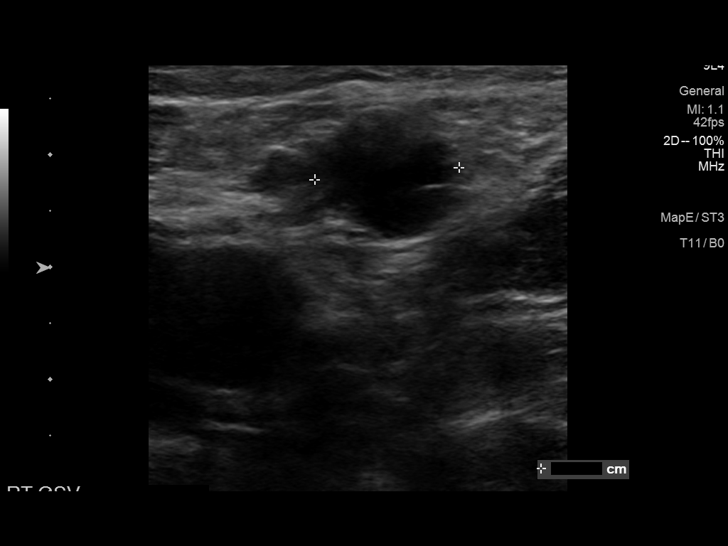
[im 51/91]
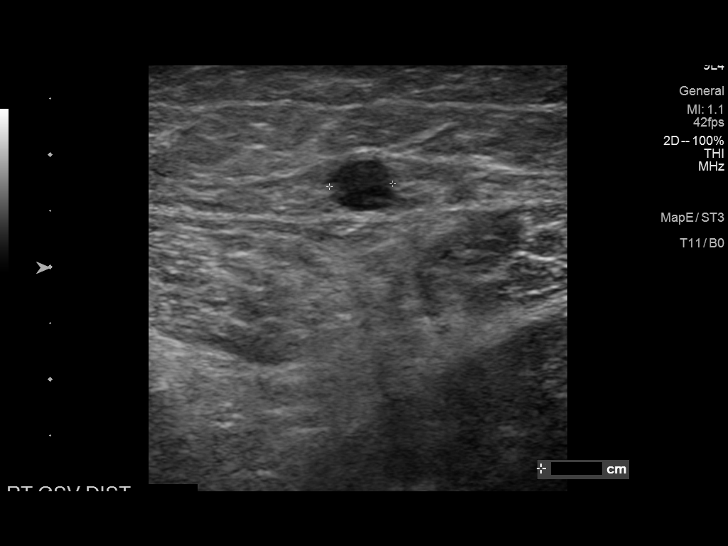
[im 59/91]
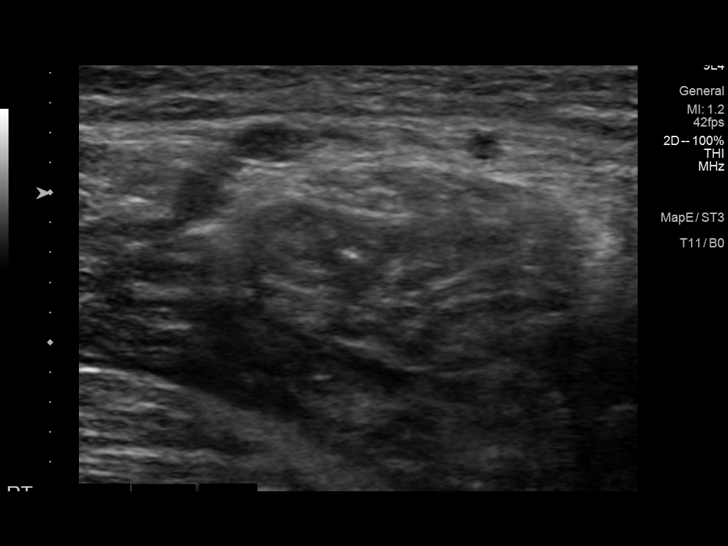
[im 67/91]
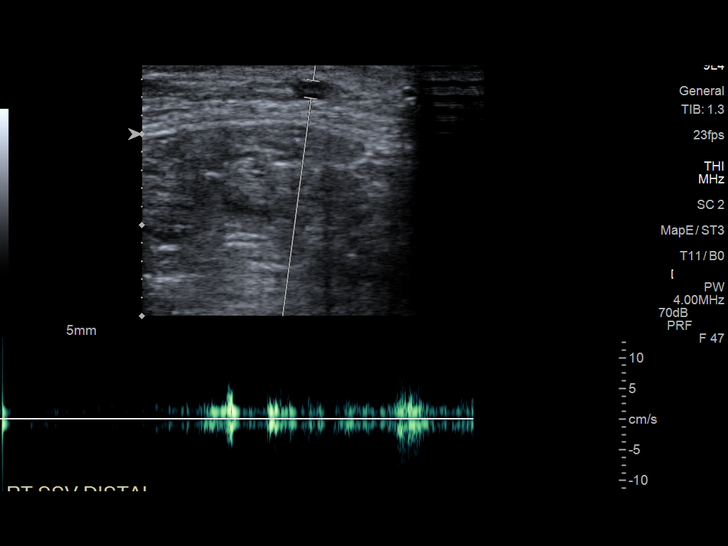
[im 75/91]
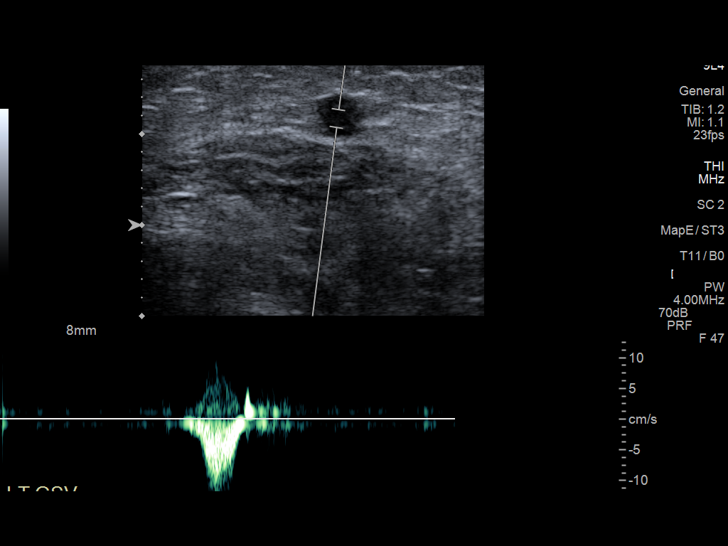
[im 83/91]
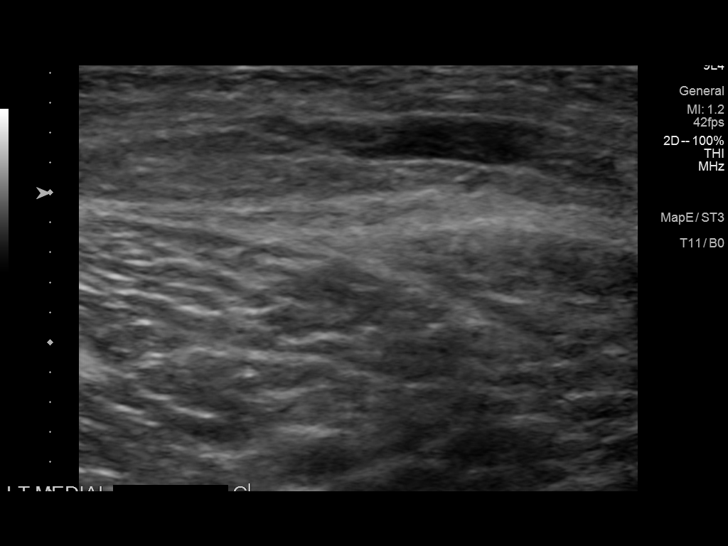
[im 91/91]
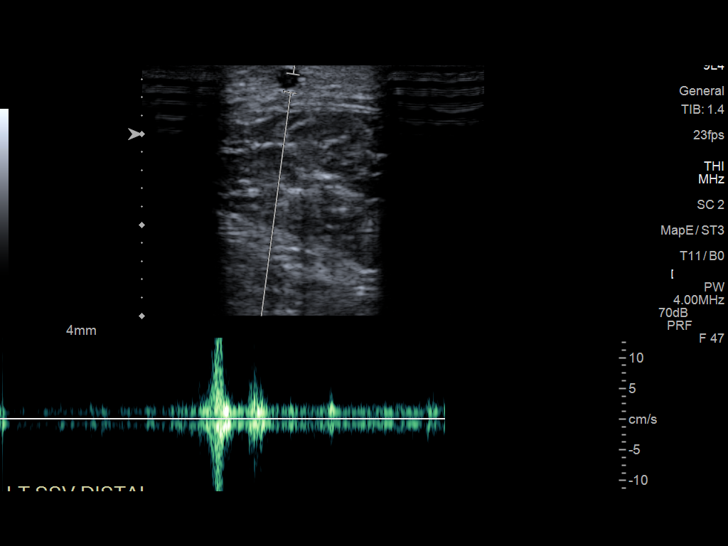

[12 of 24 positions shown; findings below may reference images not displayed]

FINDINGS: RIGHT Deep Venous System:

Evaluation of the deep venous system including the common femoral,
femoral, profunda femoral, popliteal and calf veins (where visible)
demonstrate no evidence of deep venous thrombosis. The vessels are
compressible and demonstrate normal respiratory phasicity and
response to augmentation. No evidence of deep venous reflux.

RIGHT Superficial Venous System:

SFJ: Reflux with augmentation lasting greater than 2 seconds.

GSV Prox Thigh: 13 mm in greatest diameter. Reflux with augmentation
lasting greater than 2 seconds.

GSV Mid Thigh: 6 mm.  Reflux with augmentation.

GSV Lower Thigh: 6 mm.  Reflux with augmentation.

GSV Prox Calf: 4-6 mm.  Reflux with augmentation.

GSV Mid Calf: 4 mm.  Reflux with augmentation.

GSV Distal Calf: 3 mm.  Minimal reflux.

SPJ: No true saphenopopliteal junction present.

SSV Prox: 5 mm.  Mild reflux with augmentation.

SSV Mid: 3 mm.  Mild reflux with augmentation.

SSV Distal: 4 mm.  Mild reflux with augmentation.

Other: The short saphenous vein continues in the posterior thigh as
a vein of Beena that continues along the medial aspect of the
thigh then courses anteriorly in the proximal thigh and communicates
with branches that ultimately communicate with varicosities of the
medial thigh and labial and vulvar region and back to branches that
communicate with the proximal great saphenous vein. The great
saphenous vein communicates with varicosities of the medial thigh
and calf region. The short saphenous vein demonstrates a distal
thigh perforator vein.

LEFT Deep Venous System:

Evaluation of the deep venous system including the common femoral,
femoral, profunda femoral, popliteal and calf veins (where visible)
demonstrate no evidence of deep venous thrombosis. The vessels are
compressible and demonstrate normal respiratory phasicity and
response to augmentation. No evidence of deep venous reflux.

LEFT Superficial Venous System:

SFJ: Reflux with augmentation lasting greater than 2 seconds.

GSV Prox Thigh: 11 mm.  Reflux with augmentation.

GSV Mid Thigh: 5 mm.  Reflux with augmentation.

GSV Lower Thigh: 5 mm.  Reflux with augmentation.

GSV Prox Calf: 5 mm.  Reflux with augmentation.

GSV Mid Calf: 3 mm.  Reflux with augmentation.

GSV Distal Calf: 3 mm.  Minimal reflux.

SPJ: No true saphenopopliteal junction present.

SSV Prox: 4 mm.  Mild reflux with augmentation.

SSV Mid: 3 mm.  Mild reflux with augmentation.

SSV Distal: 2 mm.  Mild reflux with augmentation.

Other: The short saphenous vein on the left also continues in the
posterior thigh as a vein of Beena along the medial aspect of
the thigh and communicates anteriorly with branches that communicate
with labial and vulvar varicosities that communicate back to
branches that communicate with the proximal great saphenous vein.
There are 2 separate mid calf perforator veins identified
communicating with the great saphenous vein. A perforator vein of
the mid calf communicates with the short saphenous vein.
Varicosities of the calf communicate with both the short saphenous
and great saphenous veins.
IMPRESSION: Bilateral superficial venous insufficiency involving bilateral great
saphenous and short saphenous veins. The short saphenous veins
continue along the posteromedial thigh as a vein of Beena
bilaterally that eventually communicate with labial and vulvar
varicosities as well as the proximal great saphenous vein. Thigh and
calf varicosities on the right and calf varicosities on the left
communicate with both great saphenous and short saphenous systems.
Some component of perforator vein disease is also present
bilaterally.
